# Patient Record
Sex: Male | Born: 1950 | Race: White | Hispanic: No | Marital: Married | Smoking: Never smoker
Health system: Southern US, Community
[De-identification: ages and names within clinical notes are randomized; demographics above are authoritative.]

## PROBLEM LIST (undated history)

## (undated) DIAGNOSIS — C61 Malignant neoplasm of prostate: Secondary | ICD-10-CM

## (undated) DIAGNOSIS — L409 Psoriasis, unspecified: Secondary | ICD-10-CM

## (undated) DIAGNOSIS — C4490 Unspecified malignant neoplasm of skin, unspecified: Secondary | ICD-10-CM

## (undated) DIAGNOSIS — M779 Enthesopathy, unspecified: Secondary | ICD-10-CM

## (undated) DIAGNOSIS — I4891 Unspecified atrial fibrillation: Secondary | ICD-10-CM

## (undated) DIAGNOSIS — L570 Actinic keratosis: Secondary | ICD-10-CM

## (undated) DIAGNOSIS — N400 Enlarged prostate without lower urinary tract symptoms: Secondary | ICD-10-CM

## (undated) HISTORY — DX: Benign prostatic hyperplasia without lower urinary tract symptoms: N40.0

## (undated) HISTORY — DX: Unspecified malignant neoplasm of skin, unspecified: C44.90

## (undated) HISTORY — DX: Malignant neoplasm of prostate: C61

## (undated) HISTORY — PX: OTHER SURGICAL HISTORY: SHX169

## (undated) HISTORY — DX: Unspecified atrial fibrillation: I48.91

## (undated) HISTORY — PX: COLONOSCOPY: SHX174

## (undated) HISTORY — DX: Psoriasis, unspecified: L40.9

## (undated) HISTORY — DX: Actinic keratosis: L57.0

---

## 2000-03-08 ENCOUNTER — Encounter: Payer: Self-pay | Admitting: Emergency Medicine

## 2000-03-08 ENCOUNTER — Emergency Department (HOSPITAL_COMMUNITY): Admission: EM | Admit: 2000-03-08 | Discharge: 2000-03-08 | Payer: Self-pay

## 2000-03-11 ENCOUNTER — Ambulatory Visit (HOSPITAL_COMMUNITY): Admission: RE | Admit: 2000-03-11 | Discharge: 2000-03-11 | Payer: Self-pay | Admitting: Internal Medicine

## 2000-03-11 ENCOUNTER — Encounter: Payer: Self-pay | Admitting: Internal Medicine

## 2008-07-07 ENCOUNTER — Ambulatory Visit: Payer: Self-pay | Admitting: Family Medicine

## 2008-07-07 DIAGNOSIS — I4891 Unspecified atrial fibrillation: Secondary | ICD-10-CM

## 2008-07-07 DIAGNOSIS — L408 Other psoriasis: Secondary | ICD-10-CM

## 2008-07-07 HISTORY — DX: Unspecified atrial fibrillation: I48.91

## 2008-07-14 ENCOUNTER — Telehealth: Payer: Self-pay | Admitting: *Deleted

## 2008-07-28 DIAGNOSIS — T7840XA Allergy, unspecified, initial encounter: Secondary | ICD-10-CM | POA: Insufficient documentation

## 2008-07-31 ENCOUNTER — Ambulatory Visit: Payer: Self-pay | Admitting: Family Medicine

## 2009-02-22 ENCOUNTER — Encounter: Payer: Self-pay | Admitting: Family Medicine

## 2009-03-13 ENCOUNTER — Ambulatory Visit: Payer: Self-pay | Admitting: Family Medicine

## 2009-03-13 LAB — CONVERTED CEMR LAB
Albumin: 4.1 g/dL (ref 3.5–5.2)
Alkaline Phosphatase: 46 units/L (ref 39–117)
BUN: 16 mg/dL (ref 6–23)
Basophils Absolute: 0 10*3/uL (ref 0.0–0.1)
Bilirubin Urine: NEGATIVE
Bilirubin, Direct: 0.1 mg/dL (ref 0.0–0.3)
Blood in Urine, dipstick: NEGATIVE
CO2: 29 meq/L (ref 19–32)
Calcium: 9.6 mg/dL (ref 8.4–10.5)
Cholesterol: 229 mg/dL — ABNORMAL HIGH (ref 0–200)
Creatinine, Ser: 1 mg/dL (ref 0.4–1.5)
Direct LDL: 158.4 mg/dL
Eosinophils Absolute: 0.2 10*3/uL (ref 0.0–0.7)
Glucose, Bld: 109 mg/dL — ABNORMAL HIGH (ref 70–99)
Glucose, Urine, Semiquant: NEGATIVE
HDL: 34.8 mg/dL — ABNORMAL LOW (ref >39.00)
Ketones, urine, test strip: NEGATIVE
Lymphocytes Relative: 33.8 % (ref 12.0–46.0)
MCHC: 35.3 g/dL (ref 30.0–36.0)
Neutro Abs: 3.2 10*3/uL (ref 1.4–7.7)
Neutrophils Relative %: 51.9 % (ref 43.0–77.0)
Nitrite: NEGATIVE
Platelets: 228 10*3/uL (ref 150.0–400.0)
Protein, U semiquant: NEGATIVE
RDW: 12.2 % (ref 11.5–14.6)
Specific Gravity, Urine: 1.02
Total Bilirubin: 1.1 mg/dL (ref 0.3–1.2)
Triglycerides: 195 mg/dL — ABNORMAL HIGH (ref 0.0–149.0)
Urobilinogen, UA: 0.2
WBC Urine, dipstick: NEGATIVE
pH: 7

## 2009-03-20 ENCOUNTER — Ambulatory Visit: Payer: Self-pay | Admitting: Family Medicine

## 2010-01-14 ENCOUNTER — Ambulatory Visit: Payer: Self-pay | Admitting: Family Medicine

## 2010-01-14 DIAGNOSIS — L723 Sebaceous cyst: Secondary | ICD-10-CM

## 2010-01-14 DIAGNOSIS — C4490 Unspecified malignant neoplasm of skin, unspecified: Secondary | ICD-10-CM

## 2010-01-14 HISTORY — DX: Unspecified malignant neoplasm of skin, unspecified: C44.90

## 2010-01-17 ENCOUNTER — Ambulatory Visit: Payer: Self-pay | Admitting: Family Medicine

## 2010-04-29 ENCOUNTER — Ambulatory Visit: Payer: Self-pay | Admitting: Family Medicine

## 2010-04-29 LAB — CONVERTED CEMR LAB
BUN: 17 mg/dL (ref 6–23)
Basophils Absolute: 0 10*3/uL (ref 0.0–0.1)
Bilirubin Urine: NEGATIVE
Blood in Urine, dipstick: NEGATIVE
Chloride: 104 meq/L (ref 96–112)
GFR calc non Af Amer: 82.43 mL/min (ref >60)
Glucose, Bld: 110 mg/dL — ABNORMAL HIGH (ref 70–99)
Glucose, Urine, Semiquant: NEGATIVE
HCT: 48.3 % (ref 39.0–52.0)
HDL: 41.3 mg/dL (ref >39.00)
Lymphs Abs: 2.2 10*3/uL (ref 0.7–4.0)
MCV: 92.3 fL (ref 78.0–100.0)
Monocytes Absolute: 0.8 10*3/uL (ref 0.1–1.0)
PSA: 5.09 ng/mL — ABNORMAL HIGH (ref 0.10–4.00)
Platelets: 254 10*3/uL (ref 150.0–400.0)
Potassium: 4.5 meq/L (ref 3.5–5.1)
RDW: 13.1 % (ref 11.5–14.6)
Specific Gravity, Urine: 1.015
TSH: 1.56 microintl units/mL (ref 0.35–5.50)
Total Bilirubin: 0.8 mg/dL (ref 0.3–1.2)
WBC Urine, dipstick: NEGATIVE
pH: 5

## 2010-05-06 ENCOUNTER — Encounter: Payer: Self-pay | Admitting: Family Medicine

## 2010-05-06 ENCOUNTER — Ambulatory Visit: Payer: Self-pay | Admitting: Family Medicine

## 2010-05-06 DIAGNOSIS — R351 Nocturia: Secondary | ICD-10-CM | POA: Insufficient documentation

## 2010-05-14 ENCOUNTER — Ambulatory Visit: Payer: Self-pay | Admitting: Family Medicine

## 2010-05-14 DIAGNOSIS — B079 Viral wart, unspecified: Secondary | ICD-10-CM | POA: Insufficient documentation

## 2010-05-17 ENCOUNTER — Telehealth: Payer: Self-pay | Admitting: Family Medicine

## 2010-05-17 DIAGNOSIS — L570 Actinic keratosis: Secondary | ICD-10-CM

## 2010-05-17 DIAGNOSIS — L820 Inflamed seborrheic keratosis: Secondary | ICD-10-CM

## 2010-06-18 ENCOUNTER — Other Ambulatory Visit: Payer: Self-pay | Admitting: Dermatology

## 2010-06-24 ENCOUNTER — Other Ambulatory Visit: Payer: Self-pay | Admitting: Family Medicine

## 2010-06-24 ENCOUNTER — Telehealth: Payer: Self-pay | Admitting: Family Medicine

## 2010-06-24 ENCOUNTER — Ambulatory Visit
Admission: RE | Admit: 2010-06-24 | Discharge: 2010-06-24 | Payer: Self-pay | Source: Home / Self Care | Attending: Family Medicine | Admitting: Family Medicine

## 2010-06-24 ENCOUNTER — Encounter: Payer: Self-pay | Admitting: Family Medicine

## 2010-06-24 LAB — PSA: PSA: 3.43 ng/mL (ref 0.10–4.00)

## 2010-07-02 NOTE — Assessment & Plan Note (Signed)
Summary: follow up cyst - rv   Vital Signs:  Patient profile:   60 year old male BP sitting:   120 / 84  (left arm) Cuff size:   regular  Vitals Entered By: Kern Reap CMA Duncan Dull) (January 17, 2010 8:33 AM) CC: follow-up visit   CC:  follow-up visit.  History of Present Illness: Weyman Croon is a 60 year old male, who comes in today for re-evaluation of an infected sebaceous cyst on his left scrotum, which we drained surgically.  Two days ago.  He said no complications.  No side effects from the antibiotics.  Allergies: No Known Drug Allergies  Physical Exam  General:  Well-developed,well-nourished,in no acute distress; alert,appropriate and cooperative throughout examination Genitalia:  I am unable to express any pus.  There is still some induration, but it may just be scar tissue from inflammation.   Impression & Recommendations:  Problem # 1:  SEBACEOUS CYST, INFECTED (ICD-706.2) Assessment Improved  Orders: No Charge Patient Arrived (NCPA0) (NCPA0)  Complete Medication List: 1)  Temovate 0.05 % Oint (Clobetasol propionate) .... Apply at bedtime as needed 2)  Keflex 500 Mg Caps (Cephalexin) .... 2 by mouth two times a day 3)  Vicodin Es 7.5-750 Mg Tabs (Hydrocodone-acetaminophen) .... Take 1 tablet by mouth three times a day as needed 4)  Phisohex 3 % Liqd (Hexachlorophene) .... Apply 2 x week  Patient Instructions: 1)  continue the warm soaks twice daily for one week and finish the antibiotics and return p.r.n. Prescriptions: PHISOHEX 3 % LIQD (HEXACHLOROPHENE) apply 2 x week  #1 pint x 10   Entered and Authorized by:   Roderick Pee MD   Signed by:   Roderick Pee MD on 01/17/2010   Method used:   Electronically to        [facility]  [phone]* (retail)       [address] [place], [place]  [address]       Ph: [phone] or [phone]       Fax: [phone]   RxID:   [id number]

## 2010-07-02 NOTE — Assessment & Plan Note (Signed)
Summary: CPX // RS/pt rescd//ccm   Vital Signs:  Patient profile:   60 year old male Height:      71 inches Weight:      214.5 pounds Temp:     97.9 degrees F oral Pulse rate:   76 / minute BP sitting:   122 / 80  (left arm) Cuff size:   regular  Vitals Entered By: Kathlene November LPN (May 06, 2010 11:48 AM) CC: CPE   CC:  CPE.  History of Present Illness: Manuel Neal is a 60 year old, married male, nonsmoker.........Manuel Neal retired from [employer]....... who comes in today for physical examination  He saw his been in excellent health.  He said no chronic health problems.  He does have an occasional folliculitis, for which he uses pHisoHex.  He has a spot on his right ear and one on his anterior chest wall.  They would like checked.  Also, he said, nocturia x 2 has difficulty going back to sleep would like some medicine for the BPH symptoms.  Review of systems negative except his paternal grandfather died of prostate cancer.  Tetanus posterior 2010 declines the flu shot  Current Medications (verified): 1)  Temovate 0.05 % Oint (Clobetasol Propionate) .... Apply At Bedtime As Needed 2)  Phisohex 3 % Liqd (Hexachlorophene) .... Apply 2 X Week  Allergies (verified): No Known Drug Allergies  Comments:  Nurse/Medical Assistant: The patient's medications and allergies were reviewed with the patient and were updated in the Medication and Allergy Lists. Kathlene November LPN (May 06, 2010 11:49 AM)  Past History:  Past medical, surgical, family and social histories (including risk factors) reviewed, and no changes noted (except as noted below). Past medical, surgical, family and social histories (including risk factors) reviewed for relevance to current acute and chronic problems.  Past Medical History: Reviewed history from 07/07/2008 and no changes required. psoriasis  Past Surgical History: Reviewed history from 07/07/2008 and no changes required. bone spur, right foot  Family  History: Reviewed history from 07/07/2008 and no changes required. Father: bile duct cancer Mother: healthy Siblings: none  Social History: Reviewed history from 07/07/2008 and no changes required. Occupation:tv comm. 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 No apparent alopecia or balding. Eyes:  No corneal or conjunctival inflammation noted. EOMI. Perrla. Funduscopic exam benign, without hemorrhages, exudates or papilledema. Vision grossly normal. Ears:  External ear exam shows no significant lesions or deformities.  Otoscopic examination reveals clear canals, tympanic membranes are intact bilaterally without bulging, retraction, inflammation or discharge. Hearing is grossly normal bilaterally. Nose:  External nasal examination shows no deformity or inflammation. Nasal mucosa are pink and moist without lesions or exudates. Mouth:  Oral mucosa and oropharynx without lesions or exudates.  Teeth in good repair. Neck:  No deformities, masses, or tenderness noted. Chest Wall:  No deformities, masses, tenderness or gynecomastia noted. Breasts:  No masses or gynecomastia noted Lungs:  Normal respiratory effort, chest expands symmetrically. Lungs are clear to auscultation, no crackles or wheezes. Heart:  Normal rate and regular rhythm. S1 and S2 normal without gallop, murmur, click, rub or other extra sounds. Abdomen:  Bowel sounds positive,abdomen soft and non-tender without masses, organomegaly or hernias  noted. Rectal:  No external abnormalities noted. Normal sphincter tone. No rectal masses or tenderness. Genitalia:  Testes bilaterally  descended without nodularity, tenderness or masses. No scrotal masses or lesions. No penis lesions or urethral discharge. Prostate:  Prostate gland firm and smooth, no enlargement, nodularity, tenderness, mass, asymmetry or induration. Msk:  No deformity or scoliosis noted of thoracic or lumbar spine.   Pulses:  R and L carotid,radial,femoral,dorsalis pedis and posterior tibial pulses are full and equal bilaterally Extremities:  No clubbing, cyanosis, edema, or deformity noted with normal full range of motion of all joints.   Neurologic:  No cranial nerve deficits noted. Station and gait are normal. Plantar reflexes are down-going bilaterally. DTRs are symmetrical throughout. Sensory, motor and coordinative functions appear intact. Skin:  total body skin exam normal except for a seborrheic keratosis on his right anterior upper chest wall and an irritated lesion in the pinna of his right ear Cervical Nodes:  No lymphadenopathy noted Axillary Nodes:  No palpable lymphadenopathy Inguinal Nodes:  No significant adenopathy Psych:  Cognition and judgment appear intact. Alert and cooperative with normal attention span and concentration. No apparent delusions, illusions, hallucinations   Impression & Recommendations:  Problem # 1:  HEALTH SCREENING (ICD-V70.0) Assessment Unchanged  Orders: Prescription Created Electronically [phone])  Problem # 2:  NOCTURIA (WEX-937.16) Assessment: New  Orders: Prescription Created Electronically [phone])  Complete Medication List: 1)  Temovate 0.05 % Oint (Clobetasol propionate) .... Apply at bedtime as needed 2)  Phisohex 3 % Liqd (Hexachlorophene) .... Apply 2 x week 3)  Doxazosin Mesylate 2 Mg Tabs (Doxazosin mesylate) .Manuel Neal.. 1 tab @ bedtime 4)  Doxycycline Hyclate 100 Mg Caps (Doxycycline hyclate) .... Take 1 tablet by mouth two times a day  Patient Instructions: 1)  return sometime in the next week to 10 days for a 30 minute appointment in the treatment  room to remove the two lesions we discussed. 2)  Begin doxycycline 100 mg b.i.d. x 1 month return in 6 weeks for follow-up 3)  Take an Aspirin every day. 4)  Please schedule a follow-up appointment in 1 year. Prescriptions: DOXYCYCLINE HYCLATE 100 MG CAPS (DOXYCYCLINE HYCLATE) Take 1 tablet by mouth two times a day  #60 x 1   Entered and Authorized by:   Roderick Pee MD   Signed by:   Roderick Pee MD on 05/06/2010   Method used:   Electronically to        [facility] # [phone]* (retail)       [address]       [place], [place]  [address]       Ph: [phone]       Fax: [phone]   RxID:   [id number] DOXAZOSIN MESYLATE 2 MG TABS (DOXAZOSIN MESYLATE) 1 tab @ bedtime  #100 x 3   Entered and Authorized by:   Roderick Pee MD   Signed by:   Roderick Pee MD on 05/06/2010   Method used:   Electronically to        [facility]  [phone]* (retail)       [address] [place], [place]  [address]       Ph: [phone] or [phone]       Fax: [phone]   RxID:   [id number]    Orders Added: 1)  Prescription Created Electronically [[id number]] 2)  Est. Patient 40-64 years [phone]

## 2010-07-02 NOTE — Assessment & Plan Note (Signed)
Summary: ?cyst in groin area/njr   Vital Signs:  Patient profile:   60 year old male Height:      71 inches Weight:      212 pounds BMI:     29.67 Temp:     98.1 degrees F oral BP sitting:   140 / 90  (left arm) Cuff size:   regular  Vitals Entered By: Kern Reap CMA Duncan Dull) (January 14, 2010 5:14 PM)  Procedure Note Last Tetanus: Tdap (07/07/2008)  Incision & Drainage: Date of onset: 01/11/2010 Indication: infected lesion Consent signed: yes  Procedure # 1: I & D    Size (in cm): 2.0 x 2.0    Location: scrotum    Instrument used: #15 blade w/scissors    Anesthesia: 1% lidocaine w/epinephrine  Cleaned and prepped with: alcohol and betadine Wound dressing: pressure dressing  CC: cyst on groin area   CC:  cyst on groin area.  History of Present Illness: Manuel Neal is a 60 year old, married man nonsmoker comes in today because of an infected sebaceous cyst in his left scrotum.  He said to previous sebaceous cyst drained on his back.  He noticed he had a cyst in his left scrotum and then about 3 days ago became red, hot, and swollen.  Allergies: No Known Drug Allergies   Complete Medication List: 1)  Temovate 0.05 % Oint (Clobetasol propionate) .... Apply at bedtime as needed 2)  Keflex 500 Mg Caps (Cephalexin) .... 2 by mouth two times a day 3)  Vicodin Es 7.5-750 Mg Tabs (Hydrocodone-acetaminophen) .... Take 1 tablet by mouth three times a day as needed  Other Orders: I&D Abscess, Simple / Single (10060)  Patient Instructions: 1)  warm soaks 3 times a day.  Take Keflex two tabs b.i.d. return Thursday for follow-up Prescriptions: VICODIN ES 7.5-750 MG TABS (HYDROCODONE-ACETAMINOPHEN) Take 1 tablet by mouth three times a day as needed  #10 x 0   Entered and Authorized by:   Roderick Pee MD   Signed by:   Roderick Pee MD on 01/14/2010   Method used:   Print then Give to Patient   RxID:   [id number] KEFLEX 500 MG CAPS (CEPHALEXIN) 2 by mouth two times a  day  #30 x 1   Entered and Authorized by:   Roderick Pee MD   Signed by:   Roderick Pee MD on 01/14/2010   Method used:   Electronically to        [facility] # [phone]* (retail)       [address]       [place], [place]  [address]       Ph: [phone]       Fax: [phone]   RxID:   [id number]

## 2010-07-04 NOTE — Miscellaneous (Signed)
Summary: Consent to Procedure  Consent to Procedure   Imported By: Maryln Gottron 05/17/2010 12:39:55  _____________________________________________________________________  External Attachment:    Type:   Image     Comment:   External Document

## 2010-07-04 NOTE — Progress Notes (Signed)
  Phone Note Outgoing Call   Summary of Call: I called the stan............ and to review the pathology.  The lesion that we removed from the ear may be or has some underlying cancer to it.  I recommend he call Margo Aye at [facility] dermatology for further evaluation Initial call taken by: Roderick Pee MD,  May 17, 2010 12:13 PM

## 2010-07-04 NOTE — Progress Notes (Signed)
  Phone Note Outgoing Call   Summary of Call: I called stan........... his PSA now is drop back to normal he wishes to start Cardura.  I recommend he take it at bedtime.  Follow-up in two months if the Cardura does not help.  The BPH Initial call taken by: Roderick Pee MD,  June 24, 2010 5:38 PM

## 2010-07-04 NOTE — Assessment & Plan Note (Signed)
Summary: 30 minute appt per dr todd//ccm   Procedure Note Last Tetanus: Tdap (07/07/2008)  Mole Biopsy/Removal: Indication: suspicious lesion Consent signed: yes  Procedure # 1: elliptical incision with 2 mm margin    Size (in cm): 1.0 x 1.0    Location: ear-left    Instrument used: #15 blade    Anesthesia: 1% lidocaine w/o epinephrine    Closure: cautery  Procedure # 2: elliptical incision with 2 mm margin    Size (in cm): 0.8 x 0.8    Region: anterior    Location: chest-right    Instrument used: #15 blade    Anesthesia: 1% lidocaine w/epinephrine    Closure: cautery  Cleaned and prepped with: alcohol Wound dressing: bacitracin and bandaid   History of Present Illness: stain is a 60 year old male, married, nonsmoker, who comes in today for removal of two lesions........... one on the upper pinna of his right ear.......... the second on his right anterior chest wall.......... and cryo- of 3 warts  Allergies: No Known Drug Allergies   Complete Medication List: 1)  Temovate 0.05 % Oint (Clobetasol propionate) .... Apply at bedtime as needed 2)  Phisohex 3 % Liqd (Hexachlorophene) .... Apply 2 x week 3)  Doxazosin Mesylate 2 Mg Tabs (Doxazosin mesylate) .[name].. 1 tab @ bedtime 4)  Doxycycline Hyclate 100 Mg Caps (Doxycycline hyclate) .... Take 1 tablet by mouth two times a day  Other Orders: Cryotherapy/Destruction benign or premalignant lesion (1st lesion)  (17000) Cryotherapy/Destruction benign or premalignant lesion (2nd-14th lesions) (17003) Shave Skin Lesion 0.6-1.0cm face/ears/eyelids/nose/lips/mm (11311) Shave Skin Lesion 0.6-1.0 cm/trunk/arm/leg (04540)  Patient Instructions: 1)  apply small amounts of Duofilm to the warts nightly and shave weekly.  Return p.r.n.   Orders Added: 1)  Cryotherapy/Destruction benign or premalignant lesion (1st lesion)  [17000] 2)  Cryotherapy/Destruction benign or premalignant lesion (2nd-14th lesions) [17003] 3)  Shave Skin  Lesion 0.6-1.0cm face/ears/eyelids/nose/lips/mm [11311] 4)  Shave Skin Lesion 0.6-1.0 cm/trunk/arm/leg [11301]

## 2010-11-08 ENCOUNTER — Ambulatory Visit (INDEPENDENT_AMBULATORY_CARE_PROVIDER_SITE_OTHER): Payer: Managed Care, Other (non HMO) | Admitting: Internal Medicine

## 2010-11-08 ENCOUNTER — Encounter: Payer: Self-pay | Admitting: Internal Medicine

## 2010-11-08 VITALS — BP 120/80 | Temp 99.0°F | Wt 214.0 lb

## 2010-11-08 DIAGNOSIS — J029 Acute pharyngitis, unspecified: Secondary | ICD-10-CM

## 2010-11-08 DIAGNOSIS — J069 Acute upper respiratory infection, unspecified: Secondary | ICD-10-CM

## 2010-11-08 LAB — POCT RAPID STREP A (OFFICE): Rapid Strep A Screen: NEGATIVE

## 2010-11-08 MED ORDER — AMOXICILLIN 875 MG PO TABS: 875.0000 mg | ORAL_TABLET | Freq: Two times a day (BID) | ORAL | Status: AC

## 2010-11-08 NOTE — Progress Notes (Signed)
  Subjective:    Patient ID: Manuel Neal, male    DOB: 01/09/1951, 60 y.o.   MRN: [id number]  HPI Pt presents to clinic for evaluation of ST and cough. Notes 4 day h/o sore throat with associated NP cough and LG fever. Attempted otc mucinex without improvement. No alleviating or exacerbating factors. No known sick exposures. No other complaints.  Reviewed pmh, medications and allergies.    Review of Systems  Constitutional: Positive for fever. Negative for chills.  HENT: Positive for congestion. Negative for ear discharge.   Respiratory: Positive for cough. Negative for shortness of breath and wheezing.   Skin: Negative for color change, pallor and rash.       Objective:   Physical Exam  Nursing note and vitals reviewed. Constitutional: He appears well-developed and well-nourished. No distress.  HENT:  Head: Normocephalic and atraumatic.  Right Ear: Tympanic membrane, external ear and ear canal normal.  Left Ear: Tympanic membrane, external ear and ear canal normal.  Nose: Nose normal.  Mouth/Throat: Mucous membranes are normal. Posterior oropharyngeal erythema present. No oropharyngeal exudate or posterior oropharyngeal edema.  Eyes: Conjunctivae are normal. Right eye exhibits no discharge. Left eye exhibits no discharge. No scleral icterus.  Neck: Neck supple.  Cardiovascular: Normal rate, regular rhythm and normal heart sounds.   Pulmonary/Chest: Effort normal and breath sounds normal. No respiratory distress. He has no wheezes. He has no rales.  Lymphadenopathy:    He has no cervical adenopathy.  Skin: Skin is warm and dry. No rash noted. He is not diaphoretic. No erythema.  Psychiatric: He has a normal mood and affect.          Assessment & Plan:

## 2010-11-08 NOTE — Assessment & Plan Note (Signed)
Obtained and reviewed neg rapid strep. Continue otc sx tx prn. Begin amoxicillin 875mg  bid if sx's do not improve within total of 8-10 days from onset. Followup if no improvement or worsening.

## 2011-05-29 ENCOUNTER — Other Ambulatory Visit (INDEPENDENT_AMBULATORY_CARE_PROVIDER_SITE_OTHER): Payer: Managed Care, Other (non HMO)

## 2011-05-29 DIAGNOSIS — Z0000 Encounter for general adult medical examination without abnormal findings: Secondary | ICD-10-CM

## 2011-05-29 LAB — CBC WITH DIFFERENTIAL/PLATELET
Basophils Relative: 0.5 % (ref 0.0–3.0)
Eosinophils Absolute: 0.2 10*3/uL (ref 0.0–0.7)
Eosinophils Relative: 3.3 % (ref 0.0–5.0)
Lymphocytes Relative: 33.2 % (ref 12.0–46.0)
MCHC: 33.5 g/dL (ref 30.0–36.0)
MCV: 92.8 fl (ref 78.0–100.0)
Monocytes Absolute: 0.7 10*3/uL (ref 0.1–1.0)
Neutrophils Relative %: 52.1 % (ref 43.0–77.0)
Platelets: 231 10*3/uL (ref 150.0–400.0)
RBC: 5.32 Mil/uL (ref 4.22–5.81)
WBC: 6.8 10*3/uL (ref 4.5–10.5)

## 2011-05-29 LAB — PSA: PSA: 5.45 ng/mL — ABNORMAL HIGH (ref 0.10–4.00)

## 2011-05-29 LAB — POCT URINALYSIS DIPSTICK
Blood, UA: NEGATIVE
Nitrite, UA: NEGATIVE
Protein, UA: NEGATIVE
Spec Grav, UA: 1.01
Urobilinogen, UA: 0.2
pH, UA: 7

## 2011-05-29 LAB — LIPID PANEL
Cholesterol: 236 mg/dL — ABNORMAL HIGH (ref 0–200)
Total CHOL/HDL Ratio: 6
Triglycerides: 204 mg/dL — ABNORMAL HIGH (ref 0.0–149.0)
VLDL: 40.8 mg/dL — ABNORMAL HIGH (ref 0.0–40.0)

## 2011-05-29 LAB — HEPATIC FUNCTION PANEL
Alkaline Phosphatase: 54 U/L (ref 39–117)
Bilirubin, Direct: 0.1 mg/dL (ref 0.0–0.3)
Total Bilirubin: 0.9 mg/dL (ref 0.3–1.2)
Total Protein: 7 g/dL (ref 6.0–8.3)

## 2011-05-29 LAB — BASIC METABOLIC PANEL
BUN: 18 mg/dL (ref 6–23)
Calcium: 9.7 mg/dL (ref 8.4–10.5)
Creatinine, Ser: 1 mg/dL (ref 0.4–1.5)

## 2011-06-02 ENCOUNTER — Encounter: Payer: Self-pay | Admitting: Family Medicine

## 2011-06-05 ENCOUNTER — Ambulatory Visit (INDEPENDENT_AMBULATORY_CARE_PROVIDER_SITE_OTHER): Payer: Managed Care, Other (non HMO) | Admitting: Family Medicine

## 2011-06-05 ENCOUNTER — Encounter: Payer: Self-pay | Admitting: Family Medicine

## 2011-06-05 DIAGNOSIS — L239 Allergic contact dermatitis, unspecified cause: Secondary | ICD-10-CM

## 2011-06-05 DIAGNOSIS — N4282 Prostatosis syndrome: Secondary | ICD-10-CM

## 2011-06-05 DIAGNOSIS — R000 Tachycardia, unspecified: Secondary | ICD-10-CM

## 2011-06-05 DIAGNOSIS — N4289 Other specified disorders of prostate: Secondary | ICD-10-CM

## 2011-06-05 DIAGNOSIS — L259 Unspecified contact dermatitis, unspecified cause: Secondary | ICD-10-CM

## 2011-06-05 DIAGNOSIS — R351 Nocturia: Secondary | ICD-10-CM

## 2011-06-05 MED ORDER — DOXYCYCLINE HYCLATE 100 MG PO CPEP: 100.0000 mg | ORAL_CAPSULE | Freq: Two times a day (BID) | ORAL | Status: DC

## 2011-06-05 MED ORDER — CLOBETASOL PROPIONATE 0.05 % EX OINT: TOPICAL_OINTMENT | Freq: Every day | CUTANEOUS | Status: DC

## 2011-06-05 NOTE — Patient Instructions (Signed)
Restart the doxycycline, one twice daily for one month recheck a PSA in 6 weeks.  Let's hold off on the Cardura for the BPH, until we do the cardiac evaluation.  I put in a request for you to see Dr. Edison Nasuti, one of our young, cardiologist, for cardiac eval

## 2011-06-05 NOTE — Progress Notes (Signed)
  Subjective:    Patient ID: Manuel Neal, male    DOB: [birth year], 61 y.o.   MRN: [id number]  HPI Manuel Neal is a 61 year old, married man nonsmoker former wrestler in the [employer] who comes in today for general physical examination  Please always been in excellent health.  He said no chronic health problems.  We did have him on Cardura 2 mg nightly for BPH nocturia however he quit taking it.  This past summer because it he thought it might be related to episodes of syncope.  He states over the past, summer he's had 3 episodes the first in May.  A second line a third in August of the sudden onset of rapid heart rate.  All 3 episodes occurred while he was playing golf.  He was able to finish his round, but he went home and has had an EKG and had a syncopal episode.  Then, his heart rate slowed down.  He felt fine and did not bother calling for evaluation.   Review of Systems  Constitutional: Negative.   HENT: Negative.   Eyes: Negative.   Respiratory: Negative.   Cardiovascular: Negative.   Gastrointestinal: Negative.   Genitourinary: Negative.   Musculoskeletal: Negative.   Skin: Negative.   Neurological: Negative.   Hematological: Negative.   Psychiatric/Behavioral: Negative.        Objective:   Physical Exam  Constitutional: He is oriented to person, place, and time. He appears well-developed and well-nourished.  HENT:  Head: Normocephalic and atraumatic.  Right Ear: External ear normal.  Left Ear: External ear normal.  Nose: Nose normal.  Mouth/Throat: Oropharynx is clear and moist.  Eyes: Conjunctivae and EOM are normal. Pupils are equal, round, and reactive to light.  Neck: Normal range of motion. Neck supple. No JVD present. No tracheal deviation present. No thyromegaly present.  Cardiovascular: Normal rate, regular rhythm, normal heart sounds and intact distal pulses.  Exam reveals no gallop and no friction rub.   No murmur heard. Pulmonary/Chest: Effort normal and breath sounds  normal. No stridor. No respiratory distress. He has no wheezes. He has no rales. He exhibits no tenderness.  Abdominal: Soft. Bowel sounds are normal. He exhibits no distension and no mass. There is no tenderness. There is no rebound and no guarding.  Genitourinary: Rectum normal and penis normal. Guaiac negative stool. No penile tenderness.       2+ symmetrical.  BPH  Musculoskeletal: Normal range of motion. He exhibits no edema and no tenderness.  Lymphadenopathy:    He has no cervical adenopathy.  Neurological: He is alert and oriented to person, place, and time. He has normal reflexes. No cranial nerve deficit. He exhibits normal muscle tone.  Skin: Skin is warm and dry. No rash noted. No erythema. No pallor.       Total body skin exam shows a lipoma, left upper posterior shoulder and multiple seborrheic keratosis  Psychiatric: He has a normal mood and affect. His behavior is normal. Judgment and thought content normal.          Assessment & Plan:  Healthy male.  History of episodic rapid heart rate.  Plan cardiac evaluation by Dr. Edison Nasuti.  BPH with nocturia x 3.  Restart Cardura 4 mg nightly  Elevated PSA with a history of prostatitis restart doxycycline 5 mg b.i.d. For one month then recheck PSA in 6 weeks

## 2011-06-06 ENCOUNTER — Other Ambulatory Visit: Payer: Self-pay | Admitting: *Deleted

## 2011-06-06 ENCOUNTER — Telehealth: Payer: Self-pay | Admitting: *Deleted

## 2011-06-06 DIAGNOSIS — N4282 Prostatosis syndrome: Secondary | ICD-10-CM

## 2011-06-06 MED ORDER — DOXYCYCLINE HYCLATE 100 MG PO CAPS: 100.0000 mg | ORAL_CAPSULE | Freq: Two times a day (BID) | ORAL | Status: AC

## 2011-06-06 MED ORDER — DOXYCYCLINE HYCLATE 100 MG PO TBEC: 100.0000 mg | DELAYED_RELEASE_TABLET | Freq: Two times a day (BID) | ORAL | Status: DC

## 2011-06-06 MED ORDER — CLOBETASOL PROPIONATE 0.05 % EX CREA: TOPICAL_CREAM | Freq: Two times a day (BID) | CUTANEOUS | Status: DC

## 2011-06-06 NOTE — Telephone Encounter (Signed)
The ATB will cost $800 is there anything else he can try?

## 2011-06-06 NOTE — Telephone Encounter (Signed)
Pharmacy suggests doxycycline hyclate

## 2011-06-19 ENCOUNTER — Encounter: Payer: Self-pay | Admitting: Cardiovascular Disease

## 2011-06-19 ENCOUNTER — Ambulatory Visit (INDEPENDENT_AMBULATORY_CARE_PROVIDER_SITE_OTHER): Payer: Managed Care, Other (non HMO) | Admitting: Cardiovascular Disease

## 2011-06-19 DIAGNOSIS — R0789 Other chest pain: Secondary | ICD-10-CM

## 2011-06-19 DIAGNOSIS — R55 Syncope and collapse: Secondary | ICD-10-CM

## 2011-06-19 DIAGNOSIS — R000 Tachycardia, unspecified: Secondary | ICD-10-CM

## 2011-06-19 NOTE — Assessment & Plan Note (Signed)
He reports at least one dozen episodes of syncope in the setting of arrhythmia. Monitor has been placed.  Nothing on clinical exam to suggest heart failure or previous MI. We'll wait on echocardiogram and stress test for now and proceed with cardiac monitor first.

## 2011-06-19 NOTE — Assessment & Plan Note (Signed)
We have ordered a Holter monitor that can be turned to an event monitor for 30 days if needed. Rhythms are concerning, particularly given his syncope. Of most concern would be a ventricular arrhythmia. PVC noted on EKG today though this may be an innocent finding. We have not started any medications at this time and we'll wait to confirm an arrhythmia prior to medical management.  We will see him back in close followup after his monitor.

## 2011-06-19 NOTE — Progress Notes (Signed)
   Patient ID: Manuel Neal, male    DOB: 04/22/1951, 61 y.o.   MRN: [id number]  HPI Comments: Manuel Neal is a very pleasant 61 year old gentleman with history of BPH, few cardiac risk factors with no diabetes, no smoking with no significant family history of underlying coronary artery disease who presents by referral from Dr. Tawanna Cooler for symptoms of syncope and palpitations.  He reports that over the past 8-9 months he has had numerous episodes of syncope. Typically it happens when he is stressed such as on the golf [place] where he competes. He has had episodes associated with other activities as well. He describes the symptoms as a pounding, rapid heart rate occasionally associated with dizziness and sometimes with syncope. Last episode was 4 days ago. Typically the palpitations and tachycardia last for several minutes at a time and if he relaxes they resolve on their on. He has not tried any medications in the past and has not had any workup so far. He would like to attribute the arrhythmia to previous prostate medications doxazosin he has not been on this medication for some time.  Otherwise he is healthy, active with no symptoms of chest pain or shortness of breath with exertion.  EKG shows normal sinus rhythm no significant ST-T wave changes, rare PVC   Outpatient Encounter Prescriptions as of 06/19/2011  Medication Sig Dispense Refill  . aspirin 81 MG tablet Take 81 mg by mouth daily.       . clobetasol cream (TEMOVATE) 0.05 % Apply topically 2 (two) times daily.  60 g  0     Review of Systems  Constitutional: Negative.   HENT: Negative.   Eyes: Negative.   Respiratory: Negative.   Cardiovascular: Positive for palpitations.       Tachycardia  Gastrointestinal: Negative.   Musculoskeletal: Negative.   Skin: Negative.   Neurological: Positive for dizziness and syncope.  Hematological: Negative.   Psychiatric/Behavioral: Negative.   All other systems reviewed and are negative.    BP  124/88  Pulse 84  Ht 6' (1.829 m)  Wt 224 lb (101.606 kg)  BMI 30.38 kg/m2  Physical Exam  Nursing note and vitals reviewed. Constitutional: He is oriented to person, place, and time. He appears well-developed and well-nourished.  HENT:  Head: Normocephalic.  Nose: Nose normal.  Mouth/Throat: Oropharynx is clear and moist.  Eyes: Conjunctivae are normal. Pupils are equal, round, and reactive to light.  Neck: Normal range of motion. Neck supple. No JVD present.  Cardiovascular: Normal rate, regular rhythm, S1 normal, S2 normal, normal heart sounds and intact distal pulses.  Exam reveals no gallop and no friction rub.   No murmur heard. Pulmonary/Chest: Effort normal and breath sounds normal. No respiratory distress. He has no wheezes. He has no rales. He exhibits no tenderness.  Abdominal: Soft. Bowel sounds are normal. He exhibits no distension. There is no tenderness.  Musculoskeletal: Normal range of motion. He exhibits no edema and no tenderness.  Lymphadenopathy:    He has no cervical adenopathy.  Neurological: He is alert and oriented to person, place, and time. Coordination normal.  Skin: Skin is warm and dry. No rash noted. No erythema.  Psychiatric: He has a normal mood and affect. His behavior is normal. Judgment and thought content normal.           Assessment and Plan

## 2011-06-19 NOTE — Patient Instructions (Addendum)
You are doing well. No medication changes were made.  We will place a monitor today  Please call us if you have new issues that need to be addressed before your next appt.  Your physician wants you to follow-up in: 5 weeks You will receive a reminder letter in the mail two months in advance. If you don't receive a letter, please call our office to schedule the follow-up appointment.  Try RED YEAST RICE two to four a day for cholesterol

## 2011-07-01 ENCOUNTER — Telehealth: Payer: Self-pay | Admitting: *Deleted

## 2011-07-01 ENCOUNTER — Institutional Professional Consult (permissible substitution): Payer: Managed Care, Other (non HMO) | Admitting: Cardiology

## 2011-07-01 NOTE — Telephone Encounter (Signed)
Received call from [facility] stating pt showing rhythm of a fib with RVR, HR 140s. Received reading and showed to Dr. Mariah Milling. 5 min later, another call stating he has wide complex rhythm at rate of 240. Recordings obtained, got in touch with pt and he states he is now home and he was playing golf (normally is what triggers his events), and he is asymptomatic at this time. At time of fast rate, he felt SOB, dizzy, and "pounding in chest." Pt only takes ASA 81mg  daily. Dr. Mariah Milling aware of event and has reviewed EKG which is possible A-flutter 1:1, fastest rate is at 242 bpm. Pt does state he would like to discuss options with EP, whether it be medical tx or catheter ablation. He is scheduled to see Dr. Graciela Husbands in 1 week. He knows in the meantime if sx return and he is unstable or they are sustained to call 911.

## 2011-07-01 NOTE — Telephone Encounter (Signed)
This patient seems to pop into flutter every time he plays golf and gets stressed. Due to see you in a week. thx Jorja Loa

## 2011-07-09 ENCOUNTER — Encounter: Payer: Self-pay | Admitting: Internal Medicine

## 2011-07-09 ENCOUNTER — Ambulatory Visit (INDEPENDENT_AMBULATORY_CARE_PROVIDER_SITE_OTHER): Payer: Managed Care, Other (non HMO) | Admitting: Internal Medicine

## 2011-07-09 VITALS — BP 122/81 | HR 76 | Ht 72.0 in | Wt 222.1 lb

## 2011-07-09 DIAGNOSIS — R55 Syncope and collapse: Secondary | ICD-10-CM

## 2011-07-09 DIAGNOSIS — I4891 Unspecified atrial fibrillation: Secondary | ICD-10-CM

## 2011-07-09 DIAGNOSIS — R000 Tachycardia, unspecified: Secondary | ICD-10-CM

## 2011-07-09 DIAGNOSIS — I519 Heart disease, unspecified: Secondary | ICD-10-CM

## 2011-07-09 MED ORDER — METOPROLOL SUCCINATE ER 25 MG PO TB24: 25.0000 mg | ORAL_TABLET | Freq: Every day | ORAL | Status: DC

## 2011-07-09 MED ORDER — ATENOLOL 25 MG PO TABS: 25.0000 mg | ORAL_TABLET | Freq: Every day | ORAL | Status: DC

## 2011-07-09 MED ORDER — NADOLOL 20 MG PO TABS: 20.0000 mg | ORAL_TABLET | Freq: Every day | ORAL | Status: DC

## 2011-07-09 NOTE — Assessment & Plan Note (Signed)
The patient has atrial fibrillation is quite coarse but then gives rise to a very regular tachycardia at a rate of about 250 beats per minute. I suspect that this is atrial fibrillation/flutter giving rise to one to one conduction. The beats are relatively broad but I don't suspect that this is ventricular tachycardia. We'll echocardiogram is unrevealing for cardiomyopathy. Echo to look at this.  We have discussed treatment options including AV nodal blockade, antiarrhythmic drug therapy, and catheter ablation. First the patient is quite averse to long-term medical therapy. He is willing to take short-term therapy which I think is important to prevent recurrences of this very rapid tachycardia. I've given her prescriptions for atenolol 25, metoprolol 25, and Corgard 20 to take in sequence to see if he can find one that he can tolerate while we proceed with workup.  I discussed the case with Dr. Johney Frame and were for the patient to him for consideration of primary catheter ablation for his arrhythmias given the severe nature of his associated symptoms. I will need to review with him the role of antecedent anticoagulation.

## 2011-07-09 NOTE — Patient Instructions (Addendum)
Need to try Atenolol 25 mg take one tablet daily for two weeks. Start Metoprolol succ 25 mg take one tablet daily for two weeks. Start Nadalol 20 mg take one tablet daily for two weeks.  Need to have an Echo. Need to get an appointment scheduled to see Dr. Johney Frame.

## 2011-07-09 NOTE — Assessment & Plan Note (Signed)
Syncope correlates with the rapid tachycardia which as mentioned above I presume to be one-to-one atrial flutter.

## 2011-07-09 NOTE — Progress Notes (Signed)
   History and Physical  Patient ID: Manuel Neal MRN: [id number], SOB: 1950-06-10 61 y.o. Date of Encounter: 07/09/2011, 3:17 PM  Primary Physician: Evette Georges, MD, MD Primary Cardiologist: Mariah Milling Primary Electrophysiologist:  Willaim Bane  Chief Complaint: syncope  History of Present Illness: Manuel Neal is a 61 y.o. male seen because of palpitations and syncope.  He has had a couple years history of tachycardia palpitations that have been irregular. Over the last 6 months it has more severe episodes where it there has been regularization of his palpitations and they have been associated with syncope. This primarily occurs when he is stressed, i.e. playing golf and reaching the end of the game. He's not had any these episodes of sitting down.  He has no known heart disease. There is no prior cardiac evaluation.  He does not snore.  He does not have hypertension diabetes or prior stroke.  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   Past Medical History  Diagnosis Date  . PSORIASIS 07/07/2008  . SEBACEOUS CYST, INFECTED 01/14/2010  . Nocturia 05/06/2010  . Keratosis     actinic, left ear  . Warts     multiple     Past Surgical History  Procedure Date  . Bone spur     right foot      Current Outpatient Prescriptions  Medication Sig Dispense Refill  . aspirin 81 MG tablet Take 81 mg by mouth daily.       . clobetasol cream (TEMOVATE) 0.05 % Apply topically 2 (two) times daily.  60 g  0  . doxycycline (VIBRAMYCIN) 100 MG capsule Take 100 mg by mouth 2 (two) times daily.         Allergies: No Known Allergies   History  Substance Use Topics  . Smoking status: Never Smoker   . Smokeless tobacco: Not on file  . Alcohol Use: Yes      Family History  Problem Relation Age of Onset  . Cancer Mother     bile duct      ROS:  Please see the history of present illness.    All other systems reviewed and  negative.   Vital Signs: Blood pressure 131/81, pulse 61, height 6' (1.829 m), weight 222 lb 1.9 oz (100.753 kg).  PHYSICAL EXAM: General:  Well nourished, well developed male in no acute distress HEENT: normal Lymph: no adenopathy Neck: no JVD Endocrine:  No thryomegaly Vascular: No carotid bruits; FA pulses 2+ bilaterally without bruits Cardiac:  normal S1, S2; RRR; no murmur Back: without kyphosis/scoliosis, no CVA tenderness Lungs:  clear to auscultation bilaterally, no wheezing, rhonchi or rales Abd: soft, nontender, no hepatomegaly Ext: no edema Musculoskeletal:  No deformities, BUE and BLE strength normal and equal Skin: warm and dry Neuro:  CNs 2-12 intact, no focal abnormalities noted Psych:  Normal affect   EKG:  I've misplaced his electrocardiogram demonstrated sinus rhythm at a rate of 63 don't remember being suppressed by conduction abnormalities.  Labs:   Lab Results  Component Value Date   WBC 6.8 05/29/2011   HGB 16.5 05/29/2011   HCT 49.4 05/29/2011   MCV 92.8 05/29/2011   PLT 231.0 05/29/2011   Lab Results  Component Value Date   CHOL 236* 05/29/2011   HDL 42.00 05/29/2011   TRIG 204.0* 05/29/2011     ASSESSMENT AND PLAN:    .s

## 2011-07-11 ENCOUNTER — Telehealth: Payer: Self-pay

## 2011-07-11 NOTE — Telephone Encounter (Signed)
Samples provided for pradaxa 150 mg take one tablet bid.

## 2011-07-11 NOTE — Telephone Encounter (Signed)
Gave instruction on pradaxa 150 mg bid with samples provided. The patient will call back if has any problems.

## 2011-07-11 NOTE — Telephone Encounter (Signed)
Message copied by Festus Aloe on Fri Jul 11, 2011 10:27 AM ------      Message from: Duke Salvia      Created: Thu Jul 10, 2011  5:41 PM       Jasmine December  Can  You start him on Pradaxa please so as to reduce any clots that might tend to develop in the left atrium so that they would have a chance to dissolve prior to ablation procedure      Thanks again for your help yesterday      steve      ----- Message -----         From: Gardiner Rhyme, MD         Sent: 07/09/2011  10:25 PM           To: Duke Salvia, MD            It would be OK to initiate anticoagulation.      I would prefer to use pradaxa for afib ablation.      I have not done PVI on xarelto yet, but am considering this in the near future.      For now, if you want to start anticoagulation in anticipation of ablation, I would recommend pradaxa.            Thanks.            ----- Message -----         From: Duke Salvia, MD         Sent: 07/09/2011   6:02 PM           To: Gardiner Rhyme, MD, Bishop Dublin, CMA            Should we start him on Xarelto in anticipation of your seeing him      Thanks steve

## 2011-07-11 NOTE — Telephone Encounter (Signed)
LMOM TC the office for pradaxa instruction.

## 2011-07-14 ENCOUNTER — Telehealth: Payer: Self-pay | Admitting: Internal Medicine

## 2011-07-14 NOTE — Telephone Encounter (Signed)
New Problem:     Patient called in because he was recently placed on dabigatran (PRADAXA) 150 MG CAPS by Dr. Johney Frame and when he began taking the medication his Afib and Atrial Flutter got worse and he began to have very hard constipated stool which was followed by rectal bleeding. He was wondering if these were normal side effects of taking his new medication.  Please call back.

## 2011-07-14 NOTE — Telephone Encounter (Signed)
Fu call °Pt calling back again °

## 2011-07-14 NOTE — Telephone Encounter (Signed)
SPOKE WITH PT  IS COMPLAINING OF  SMALL AMT OF RECTAL BLEEDING WITH CONSTIPATION  ALSO  LATE AFTERNOON  HEART FEELS LIKE IT IS RACING. PT WANTING TO KNOW IF NEEDS TO MOVE APPT UP WITH DR Johney Frame  PT AWARE WILL DISCUSS WITH DR Johney Frame.PER DR ALLRED   CONT PRADAXA   TRY OTC STOOL SOFTNER AND TO F/U WITH PMD  IN REGARDS TO RECTAL BLEEDING ALSO MAY INCREASE ATENOLOL TO 50 MG  FOR RACING HEART AND TO KEEP APPT AS SCHEDULED.PT AWARE./CY

## 2011-07-14 NOTE — Telephone Encounter (Signed)
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 Will forward to triage until I can get clarified with Lawanna Kobus if I am supposed to be handling Dr. Beverlee Nims calls as I am not familiar with these patient's.

## 2011-07-15 ENCOUNTER — Other Ambulatory Visit: Payer: Self-pay

## 2011-07-15 ENCOUNTER — Other Ambulatory Visit (INDEPENDENT_AMBULATORY_CARE_PROVIDER_SITE_OTHER): Payer: Managed Care, Other (non HMO) | Admitting: *Deleted

## 2011-07-15 DIAGNOSIS — I4891 Unspecified atrial fibrillation: Secondary | ICD-10-CM

## 2011-07-15 DIAGNOSIS — I519 Heart disease, unspecified: Secondary | ICD-10-CM

## 2011-07-17 ENCOUNTER — Other Ambulatory Visit (INDEPENDENT_AMBULATORY_CARE_PROVIDER_SITE_OTHER): Payer: Managed Care, Other (non HMO)

## 2011-07-17 ENCOUNTER — Other Ambulatory Visit: Payer: Managed Care, Other (non HMO)

## 2011-07-17 ENCOUNTER — Other Ambulatory Visit: Payer: Managed Care, Other (non HMO) | Admitting: *Deleted

## 2011-07-17 DIAGNOSIS — N4289 Other specified disorders of prostate: Secondary | ICD-10-CM

## 2011-07-17 DIAGNOSIS — N4282 Prostatosis syndrome: Secondary | ICD-10-CM

## 2011-07-23 ENCOUNTER — Telehealth: Payer: Self-pay | Admitting: Family Medicine

## 2011-07-23 NOTE — Telephone Encounter (Signed)
Patient called stating that he would like a call back with lab results.

## 2011-07-23 NOTE — Telephone Encounter (Signed)
Left detailed message on machine with lab results. 

## 2011-07-24 ENCOUNTER — Telehealth: Payer: Self-pay | Admitting: Internal Medicine

## 2011-07-24 NOTE — Telephone Encounter (Signed)
Attempted to call pt but i was told the call was restricted and could not go through due to [employer]

## 2011-07-24 NOTE — Telephone Encounter (Signed)
Attempted to call pt no answer. Call restricted.

## 2011-07-24 NOTE — Telephone Encounter (Signed)
Dr Klein patient 

## 2011-07-24 NOTE — Telephone Encounter (Signed)
Fu call °Patient returning your call °

## 2011-07-25 ENCOUNTER — Ambulatory Visit: Payer: Managed Care, Other (non HMO) | Admitting: Cardiovascular Disease

## 2011-07-25 ENCOUNTER — Telehealth: Payer: Self-pay | Admitting: Internal Medicine

## 2011-07-25 NOTE — Telephone Encounter (Signed)
Follow up from previous call:  Manuel Neal states someone call him this week. Returning call back Manuel Neal is aware that he has appt to see Dr. Johney Frame on 3/1.

## 2011-07-25 NOTE — Telephone Encounter (Signed)
I left a message for the patient to call his cell # as his home # is restricted by [employer].

## 2011-07-25 NOTE — Telephone Encounter (Signed)
F/U   Patient returning nurse HM call, please return  Call to patient on cell [phone]

## 2011-07-25 NOTE — Telephone Encounter (Signed)
07/25/11--called pt and gave results of ECHO--was not sure this was why Manuel Neal called pt, but could find nothing else he wasn't aware of--nt

## 2011-08-01 ENCOUNTER — Encounter: Payer: Self-pay | Admitting: Internal Medicine

## 2011-08-01 ENCOUNTER — Ambulatory Visit (INDEPENDENT_AMBULATORY_CARE_PROVIDER_SITE_OTHER): Payer: Managed Care, Other (non HMO) | Admitting: Internal Medicine

## 2011-08-01 VITALS — BP 121/82 | HR 61 | Resp 18 | Ht 72.0 in | Wt 215.8 lb

## 2011-08-01 DIAGNOSIS — I4891 Unspecified atrial fibrillation: Secondary | ICD-10-CM

## 2011-08-01 DIAGNOSIS — R55 Syncope and collapse: Secondary | ICD-10-CM

## 2011-08-01 LAB — CBC WITH DIFFERENTIAL/PLATELET
Eosinophils Relative: 3 % (ref 0–5)
HCT: 47.5 % (ref 39.0–52.0)
Hemoglobin: 16.3 g/dL (ref 13.0–17.0)
Lymphocytes Relative: 30 % (ref 12–46)
MCV: 90 fL (ref 78.0–100.0)
Monocytes Absolute: 0.7 10*3/uL (ref 0.1–1.0)
Monocytes Relative: 9 % (ref 3–12)
Neutro Abs: 4.1 10*3/uL (ref 1.7–7.7)
RDW: 12.7 % (ref 11.5–15.5)
WBC: 7.2 10*3/uL (ref 4.0–10.5)

## 2011-08-01 LAB — BASIC METABOLIC PANEL
CO2: 23 mEq/L (ref 19–32)
Calcium: 9.5 mg/dL (ref 8.4–10.5)
Glucose, Bld: 87 mg/dL (ref 70–99)
Potassium: 3.7 mEq/L (ref 3.5–5.3)
Sodium: 139 mEq/L (ref 135–145)

## 2011-08-01 MED ORDER — DABIGATRAN ETEXILATE MESYLATE 150 MG PO CAPS: 150.0000 mg | ORAL_CAPSULE | Freq: Two times a day (BID) | ORAL | Status: DC

## 2011-08-01 MED ORDER — ATENOLOL 25 MG PO TABS: 25.0000 mg | ORAL_TABLET | Freq: Every day | ORAL | Status: DC

## 2011-08-01 NOTE — Patient Instructions (Signed)
Your physician has requested that you have a TEE. During a TEE, sound waves are used to create images of your heart. It provides your doctor with information about the size and shape of your heart and how well your heart's chambers and valves are working. In this test, a transducer is attached to the end of a flexible tube that's guided down your throat and into your esophagus (the tube leading from you mouth to your stomach) to get a more detailed image of your heart. You are not awake for the procedure. Please see the instruction sheet given to you today. For further information please visit [email or web address]   Your physician has recommended that you have an ablation. Catheter ablation is a medical procedure used to treat some cardiac arrhythmias (irregular heartbeats). During catheter ablation, a long, thin, flexible tube is put into a blood vessel in your groin (upper thigh), or neck. This tube is called an ablation catheter. It is then guided to your heart through the blood vessel. Radio frequency waves destroy small areas of heart tissue where abnormal heartbeats may cause an arrhythmia to start. Please see the instruction sheet given to you today.  Manuel Neal or Manuel Neal will call you Monday with times and instructions.  [phone].

## 2011-08-02 LAB — APTT: aPTT: 36 seconds (ref 24–37)

## 2011-08-02 LAB — PROTIME-INR: INR: 0.98 (ref <1.50)

## 2011-08-03 NOTE — Assessment & Plan Note (Signed)
The patient has symptomatic atrial fibrillation and atrial flutter.  He has failed medical therapy with atenolol.  He is presently anticoagulated with pradaxa. Therapeutic strategies for afib and atrial flutter including medicine and ablation were discussed in detail with the patient today. Risk, benefits, and alternatives to EP study and radiofrequency ablation for afib were also discussed in detail today. These risks include but are not limited to stroke, bleeding, vascular damage, tamponade, perforation, damage to the esophagus, lungs, and other structures, pulmonary vein stenosis, worsening renal function, AV block requiring PPM, and death. The patient understands these risk and wishes to proceed.  We will therefore proceed with catheter ablation at the next available time.

## 2011-08-03 NOTE — Assessment & Plan Note (Signed)
Per Dr Graciela Husbands, possibly due to 1:1 atrial flutter Will plan EPS and ablation as above

## 2011-08-03 NOTE — Progress Notes (Signed)
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  He reports symptoms of tachypalpitations for several years.  Episodes have increased in frequency and duration over the past 6 months.  He finds that episodes occur frequently when playing golf or when emotionally upset.  Episodes have been associated with syncope.  He was evaluated by Dr Mariah Milling and had an event monitor placed.  This documented atrial fibrillation.  He also had a very regular tachycardia at a rate of about 250 beats per minute.  This was reviewed by Dr Graciela Husbands and felt to represent atrial flutter with 1:1 conduction rather than VT.  He has been initiated on pradaxa and atenolol  He is referred for consideration of catheter ablation for treatment of his atrial arrhythmias.  Today, he denies symptoms of chest pain, shortness of breath, lower extremity edema, snoring, or neurologic sequela. The patient is tolerating medications without difficulties and is otherwise without complaint today.   Past Medical History  Diagnosis Date  . Atrial fibrillation /Atrial flutter 07/07/2008  . Skin cancer 01/14/2010  . Nocturia 05/06/2010  . Keratosis     actinic, left ear  . Warts     multiple  . Syncope   . Psoriasis   . BPH (benign prostatic hyperplasia)    Past Surgical History  Procedure Date  . Bone spur     right foot    Current Outpatient Prescriptions  Medication Sig Dispense Refill  . atenolol (TENORMIN) 25 MG tablet Take 1 tablet (25 mg total) by mouth daily.  30 tablet  11  . dabigatran (PRADAXA) 150 MG CAPS Take 1 capsule (150 mg total) by mouth every 12 (twelve) hours.  60 capsule  11  . fish oil-omega-3 fatty acids 1000 MG capsule Take 2 g by mouth daily.      . Multiple Vitamin (MULITIVITAMIN WITH MINERALS) TABS Take 1 tablet by mouth daily.      . Red  Yeast Rice 600 MG CAPS Take by mouth.      . saw palmetto 160 MG capsule Take 160 mg by mouth 2 (two) times daily.      . clobetasol cream (TEMOVATE) 0.05 % Apply topically 2 (two) times daily.  60 g  0  . doxycycline (VIBRAMYCIN) 100 MG capsule Take 100 mg by mouth 2 (two) times daily.        No Known Allergies  History   Social History  . Marital Status: Married    Spouse Name: N/A    Number of Children: N/A  . Years of Education: N/A   Occupational History  . Not on file.   Social History Main Topics  . Smoking status: Never Smoker   . Smokeless tobacco: Not on file  . Alcohol Use: Yes     rare  . Drug Use: No  . Sexually Active: Not on file   Other Topics Concern  . 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    Family History  Problem Relation Age of Onset  . Cancer Mother     bile duct    ROS- All systems are reviewed and negative except as per the HPI above  Physical Exam: Filed Vitals:   08/01/11 1417  BP: 121/82  Pulse: 61  Resp: 18  Height: 6' (1.829 m)  Weight: 215 lb 12.8 oz (97.886 kg)    GEN- The patient is well appearing, alert and oriented x 3 today.   Head- normocephalic, atraumatic Eyes-  Sclera clear, conjunctiva pink Ears- hearing intact Oropharynx- clear Neck- supple, no JVP Lymph- no cervical lymphadenopathy Lungs- Clear to ausculation bilaterally, normal work of breathing Heart- Regular rate and rhythm, no murmurs, rubs or gallops, PMI not laterally displaced GI- soft, NT, ND, + BS Extremities- no clubbing, cyanosis, or edema MS- no significant deformity or atrophy Skin- no rash or lesion Psych- euthymic mood, full affect Neuro- strength and sensation are intact  Echo 07/15/11- EF 50-55%, mild MR, LA size 42 mm  Assessment and Plan:

## 2011-08-04 ENCOUNTER — Encounter: Payer: Self-pay | Admitting: Family Medicine

## 2011-08-04 ENCOUNTER — Encounter: Payer: Self-pay | Admitting: *Deleted

## 2011-08-04 ENCOUNTER — Encounter (HOSPITAL_COMMUNITY): Payer: Self-pay | Admitting: Pharmacy Technician

## 2011-08-04 ENCOUNTER — Ambulatory Visit (INDEPENDENT_AMBULATORY_CARE_PROVIDER_SITE_OTHER): Payer: Managed Care, Other (non HMO) | Admitting: Family Medicine

## 2011-08-04 ENCOUNTER — Other Ambulatory Visit: Payer: Self-pay | Admitting: *Deleted

## 2011-08-04 DIAGNOSIS — N400 Enlarged prostate without lower urinary tract symptoms: Secondary | ICD-10-CM | POA: Insufficient documentation

## 2011-08-04 DIAGNOSIS — I4891 Unspecified atrial fibrillation: Secondary | ICD-10-CM

## 2011-08-04 NOTE — Progress Notes (Signed)
  Patient Name: Manuel Neal Date of Birth: September 17, 1950 Age: 61 y.o. Medical Record Number: [id number] Gender: male Date of Encounter: 08/04/2011  History of Present Illness:  Manuel Neal is a 61 y.o. 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 Johney Frame tomorrow  Had some BPH, occ increased urination  No smoker AF, had RVR with pulse to 250, scheduled for ablation tomorrow  Patient Active Problem List  Diagnoses  . PSORIASIS  . NOCTURIA  . WARTS, MULTIPLE  . ACTINIC KERATOSIS, EAR, LEFT  . Syncope  . Atrial fibrillation /Atrial flutter  . BPH (benign prostatic hyperplasia)   Past Medical History  Diagnosis Date  . Atrial fibrillation /Atrial flutter 07/07/2008  . Skin cancer 01/14/2010  . Nocturia 05/06/2010  . Keratosis     actinic, left ear  . Warts     multiple  . Syncope   . Psoriasis   . BPH (benign prostatic hyperplasia)    Past Surgical History  Procedure Date  . Bone spur     right foot   History  Substance Use Topics  . Smoking status: Never Smoker   . Smokeless tobacco: Not on file  . Alcohol Use: Yes     rare   Family History  Problem Relation Age of Onset  . Cancer Mother     bile duct    Past Medical History, Surgical History, Social History, Family History, Problem List, Medications, and Allergies have been reviewed and updated if relevant.  Review of Systems:  GEN: No acute illnesses, no fevers, chills. GI: No n/v/d, eating normally Interactive and getting along well at home.  Otherwise, ROS is as per the HPI.   Physical Examination: Filed Vitals:   08/04/11 1402  BP: 114/80  Pulse: 50  Temp: 98.1 F (36.7 C)  TempSrc: Oral  Height: 6' (1.829 m)  Weight: 216 lb 12.8 oz (98.34 kg)  SpO2: 98%    Body mass index is 29.40 kg/(m^2).   GEN: WDWN, NAD, Non-toxic, A & O x 3 HEENT: Atraumatic,  Normocephalic. Neck supple. No masses, No LAD. Ears and Nose: No external deformity. CV: RRR currently PULM: CTA B, no wheezes, crackles, rhonchi. No retractions. No resp. distress. No accessory muscle use. EXTR: No c/c/e NEURO Normal gait.  PSYCH: Normally interactive. Conversant. Not depressed or anxious appearing.  Calm demeanor.   Assessment and Plan: 1. BPH (benign prostatic hyperplasia)   2. Atrial fibrillation /Atrial flutter     Doing well New to est  Ablation in am

## 2011-08-05 ENCOUNTER — Encounter (HOSPITAL_COMMUNITY)
Admission: RE | Disposition: A | Discharge status: Home / Self Care | Payer: Self-pay | Source: Ambulatory Visit | Attending: Cardiovascular Disease

## 2011-08-05 ENCOUNTER — Ambulatory Visit (HOSPITAL_COMMUNITY): Payer: Managed Care, Other (non HMO)

## 2011-08-05 ENCOUNTER — Ambulatory Visit (HOSPITAL_COMMUNITY)
Admission: RE | Admit: 2011-08-05 | Payer: Managed Care, Other (non HMO) | Source: Ambulatory Visit | Admitting: Internal Medicine

## 2011-08-05 ENCOUNTER — Encounter (HOSPITAL_COMMUNITY): Payer: Self-pay | Admitting: *Deleted

## 2011-08-05 ENCOUNTER — Ambulatory Visit (HOSPITAL_COMMUNITY)
Admission: RE | Admit: 2011-08-05 | Discharge: 2011-08-06 | Disposition: A | Discharge status: Home / Self Care | Payer: Managed Care, Other (non HMO) | Source: Ambulatory Visit | Attending: Cardiovascular Disease | Admitting: Cardiovascular Disease

## 2011-08-05 ENCOUNTER — Encounter (HOSPITAL_COMMUNITY): Payer: Self-pay

## 2011-08-05 DIAGNOSIS — I4892 Unspecified atrial flutter: Secondary | ICD-10-CM | POA: Insufficient documentation

## 2011-08-05 DIAGNOSIS — I4891 Unspecified atrial fibrillation: Secondary | ICD-10-CM

## 2011-08-05 DIAGNOSIS — N400 Enlarged prostate without lower urinary tract symptoms: Secondary | ICD-10-CM | POA: Insufficient documentation

## 2011-08-05 DIAGNOSIS — R55 Syncope and collapse: Secondary | ICD-10-CM | POA: Insufficient documentation

## 2011-08-05 HISTORY — PX: OTHER SURGICAL HISTORY: SHX169

## 2011-08-05 HISTORY — DX: Enthesopathy, unspecified: M77.9

## 2011-08-05 HISTORY — PX: TEE WITHOUT CARDIOVERSION: SHX5443

## 2011-08-05 HISTORY — PX: ATRIAL FIBRILLATION ABLATION: SHX5456

## 2011-08-05 LAB — POCT ACTIVATED CLOTTING TIME
Activated Clotting Time: 204 seconds
Activated Clotting Time: 166 seconds

## 2011-08-05 LAB — MRSA PCR SCREENING: MRSA by PCR: NEGATIVE

## 2011-08-05 SURGERY — ATRIAL FIBRILLATION ABLATION
Anesthesia: Monitor Anesthesia Care

## 2011-08-05 SURGERY — ECHOCARDIOGRAM, TRANSESOPHAGEAL
Anesthesia: Moderate Sedation

## 2011-08-05 MED ORDER — EPHEDRINE SULFATE 50 MG/ML IJ SOLN
INTRAMUSCULAR | Status: DC | PRN
  Administered 2011-08-05 (×5): 5 mg via INTRAVENOUS

## 2011-08-05 MED ORDER — PROTAMINE SULFATE 10 MG/ML IV SOLN
INTRAVENOUS | Status: DC | PRN
  Administered 2011-08-05: 10 mg via INTRAVENOUS

## 2011-08-05 MED ORDER — ONDANSETRON HCL 4 MG/2ML IJ SOLN
INTRAMUSCULAR | Status: DC | PRN
  Administered 2011-08-05: 4 mg via INTRAVENOUS

## 2011-08-05 MED ORDER — SODIUM CHLORIDE 0.9 % IJ SOLN
3.0000 mL | Freq: Two times a day (BID) | INTRAMUSCULAR | Status: DC
  Administered 2011-08-05: 3 mL via INTRAVENOUS

## 2011-08-05 MED ORDER — FENTANYL CITRATE 0.05 MG/ML IJ SOLN
INTRAMUSCULAR | Status: AC
  Filled 2011-08-05: qty 2

## 2011-08-05 MED ORDER — FENTANYL CITRATE 0.05 MG/ML IJ SOLN
INTRAMUSCULAR | Status: DC | PRN
  Administered 2011-08-05: 25 ug via INTRAVENOUS
  Administered 2011-08-05: 50 ug via INTRAVENOUS
  Administered 2011-08-05: 25 ug via INTRAVENOUS

## 2011-08-05 MED ORDER — SODIUM CHLORIDE 0.9 % IJ SOLN: 3.0000 mL | INTRAMUSCULAR | Status: DC | PRN

## 2011-08-05 MED ORDER — MIDAZOLAM HCL 10 MG/2ML IJ SOLN
INTRAMUSCULAR | Status: DC | PRN
  Administered 2011-08-05 (×3): 1 mg via INTRAVENOUS
  Administered 2011-08-05: 2 mg via INTRAVENOUS

## 2011-08-05 MED ORDER — ISOPROTERENOL HCL 0.2 MG/ML IJ SOLN
1000.0000 ug | INTRAVENOUS | Status: DC | PRN
  Administered 2011-08-05: 20 ug/kg/min via INTRAVENOUS

## 2011-08-05 MED ORDER — SODIUM CHLORIDE 0.9 % IJ SOLN: 3.0000 mL | Freq: Two times a day (BID) | INTRAMUSCULAR | Status: DC

## 2011-08-05 MED ORDER — FENTANYL CITRATE 0.05 MG/ML IJ SOLN: 250.0000 ug | Freq: Once | INTRAMUSCULAR | Status: DC

## 2011-08-05 MED ORDER — DEXTROSE 50 % IV SOLN
INTRAVENOUS | Status: AC
  Filled 2011-08-05: qty 50

## 2011-08-05 MED ORDER — MIDAZOLAM HCL 5 MG/5ML IJ SOLN
INTRAMUSCULAR | Status: DC | PRN
  Administered 2011-08-05: 2 mg via INTRAVENOUS

## 2011-08-05 MED ORDER — PROPOFOL 10 MG/ML IV EMUL
INTRAVENOUS | Status: DC | PRN
  Administered 2011-08-05: 200 mg via INTRAVENOUS

## 2011-08-05 MED ORDER — DABIGATRAN ETEXILATE MESYLATE 150 MG PO CAPS
150.0000 mg | ORAL_CAPSULE | Freq: Two times a day (BID) | ORAL | Status: DC
  Administered 2011-08-05 – 2011-08-06 (×2): 150 mg via ORAL
  Filled 2011-08-05 (×4): qty 1

## 2011-08-05 MED ORDER — ONDANSETRON HCL 4 MG/2ML IJ SOLN
4.0000 mg | Freq: Four times a day (QID) | INTRAMUSCULAR | Status: DC | PRN
  Administered 2011-08-06: 4 mg via INTRAVENOUS
  Filled 2011-08-05: qty 2

## 2011-08-05 MED ORDER — DEXTROSE 5 % IV SOLN
INTRAVENOUS | Status: AC
  Filled 2011-08-05: qty 250

## 2011-08-05 MED ORDER — HYDROXYUREA 500 MG PO CAPS
ORAL_CAPSULE | ORAL | Status: AC
  Filled 2011-08-05: qty 1

## 2011-08-05 MED ORDER — HEPARIN SODIUM (PORCINE) 1000 UNIT/ML IJ SOLN
INTRAMUSCULAR | Status: DC | PRN
  Administered 2011-08-05 (×2): 5 mL via INTRAVENOUS

## 2011-08-05 MED ORDER — LACTATED RINGERS IV SOLN
INTRAVENOUS | Status: DC | PRN
  Administered 2011-08-05 (×2): via INTRAVENOUS

## 2011-08-05 MED ORDER — BUTAMBEN-TETRACAINE-BENZOCAINE 2-2-14 % EX AERO
INHALATION_SPRAY | CUTANEOUS | Status: DC | PRN
  Administered 2011-08-05: 2 via TOPICAL

## 2011-08-05 MED ORDER — HYDROCODONE-ACETAMINOPHEN 5-325 MG PO TABS
1.0000 | ORAL_TABLET | ORAL | Status: DC | PRN
  Administered 2011-08-05: 2 via ORAL
  Filled 2011-08-05: qty 2

## 2011-08-05 MED ORDER — HEPARIN SODIUM (PORCINE) 1000 UNIT/ML IJ SOLN
INTRAMUSCULAR | Status: AC
  Filled 2011-08-05: qty 1

## 2011-08-05 MED ORDER — LIDOCAINE HCL (CARDIAC) 20 MG/ML IV SOLN
INTRAVENOUS | Status: DC | PRN
  Administered 2011-08-05: 80 mg via INTRAVENOUS

## 2011-08-05 MED ORDER — ACETAMINOPHEN 325 MG PO TABS: 650.0000 mg | ORAL_TABLET | ORAL | Status: DC | PRN

## 2011-08-05 MED ORDER — SODIUM CHLORIDE 0.45 % IV SOLN
INTRAVENOUS | Status: DC
  Administered 2011-08-05: 09:00:00 via INTRAVENOUS

## 2011-08-05 MED ORDER — BENZOCAINE 20 % MT SOLN: 1.0000 "application " | OROMUCOSAL | Status: DC | PRN

## 2011-08-05 MED ORDER — MIDAZOLAM HCL 10 MG/2ML IJ SOLN: 10.0000 mg | Freq: Once | INTRAMUSCULAR | Status: DC

## 2011-08-05 MED ORDER — MIDAZOLAM HCL 10 MG/2ML IJ SOLN
INTRAMUSCULAR | Status: AC
  Filled 2011-08-05: qty 2

## 2011-08-05 MED ORDER — FENTANYL CITRATE 0.05 MG/ML IJ SOLN
INTRAMUSCULAR | Status: DC | PRN
  Administered 2011-08-05 (×5): 25 ug via INTRAVENOUS
  Administered 2011-08-05: 50 ug via INTRAVENOUS
  Administered 2011-08-05: 25 ug via INTRAVENOUS

## 2011-08-05 MED ORDER — SODIUM CHLORIDE 0.9 % IV SOLN: 250.0000 mL | INTRAVENOUS | Status: DC | PRN

## 2011-08-05 NOTE — H&P (View-Only) (Signed)
Primary Care Physician: Spencer Copland, MD, MD Referring Physician:  Dr Klein   Manuel Neal is a 60 y.o. male with a h/o paroxysmal atrial fibrillation and atrial flutter who presents today for further EP evaluation.  He reports symptoms of tachypalpitations for several years.  Episodes have increased in frequency and duration over the past 6 months.  He finds that episodes occur frequently when playing golf or when emotionally upset.  Episodes have been associated with syncope.  He was evaluated by Dr Gollan and had an event monitor placed.  This documented atrial fibrillation.  He also had a very regular tachycardia at a rate of about 250 beats per minute.  This was reviewed by Dr Klein and felt to represent atrial flutter with 1:1 conduction rather than VT.  He has been initiated on pradaxa and atenolol  He is referred for consideration of catheter ablation for treatment of his atrial arrhythmias.  Today, he denies symptoms of chest pain, shortness of breath, lower extremity edema, snoring, or neurologic sequela. The patient is tolerating medications without difficulties and is otherwise without complaint today.   Past Medical History  Diagnosis Date  . Atrial fibrillation /Atrial flutter 07/07/2008  . Skin cancer 01/14/2010  . Nocturia 05/06/2010  . Keratosis     actinic, left ear  . Warts     multiple  . Syncope   . Psoriasis   . BPH (benign prostatic hyperplasia)    Past Surgical History  Procedure Date  . Bone spur     right foot    Current Outpatient Prescriptions  Medication Sig Dispense Refill  . atenolol (TENORMIN) 25 MG tablet Take 1 tablet (25 mg total) by mouth daily.  30 tablet  11  . dabigatran (PRADAXA) 150 MG CAPS Take 1 capsule (150 mg total) by mouth every 12 (twelve) hours.  60 capsule  11  . fish oil-omega-3 fatty acids 1000 MG capsule Take 2 g by mouth daily.      . Multiple Vitamin (MULITIVITAMIN WITH MINERALS) TABS Take 1 tablet by mouth daily.      . Red  Yeast Rice 600 MG CAPS Take by mouth.      . saw palmetto 160 MG capsule Take 160 mg by mouth 2 (two) times daily.      . clobetasol cream (TEMOVATE) 0.05 % Apply topically 2 (two) times daily.  60 g  0  . doxycycline (VIBRAMYCIN) 100 MG capsule Take 100 mg by mouth 2 (two) times daily.        No Known Allergies  History   Social History  . Marital Status: Married    Spouse Name: N/A    Number of Children: N/A  . Years of Education: N/A   Occupational History  . Not on file.   Social History Main Topics  . Smoking status: Never Smoker   . Smokeless tobacco: Not on file  . Alcohol Use: Yes     rare  . Drug Use: No  . Sexually Active: Not on file   Other Topics Concern  . 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    Family History  Problem Relation Age of Onset  . Cancer Mother     bile duct    ROS- All systems are reviewed and negative except as per the HPI above  Physical Exam: Filed Vitals:   08/01/11 1417  BP: 121/82    Pulse: 61  Resp: 18  Height: 6' (1.829 m)  Weight: 215 lb 12.8 oz (97.886 kg)    GEN- The patient is well appearing, alert and oriented x 3 today.   Head- normocephalic, atraumatic Eyes-  Sclera clear, conjunctiva pink Ears- hearing intact Oropharynx- clear Neck- supple, no JVP Lymph- no cervical lymphadenopathy Lungs- Clear to ausculation bilaterally, normal work of breathing Heart- Regular rate and rhythm, no murmurs, rubs or gallops, PMI not laterally displaced GI- soft, NT, ND, + BS Extremities- no clubbing, cyanosis, or edema MS- no significant deformity or atrophy Skin- no rash or lesion Psych- euthymic mood, full affect Neuro- strength and sensation are intact  Echo 07/15/11- EF 50-55%, mild MR, LA size 42 mm  Assessment and Plan:  

## 2011-08-05 NOTE — Progress Notes (Signed)
  Echocardiogram Echocardiogram Transesophageal has been performed.  Dorena Cookey 08/05/2011, 11:01 AM

## 2011-08-05 NOTE — Transfer of Care (Signed)
Immediate Anesthesia Transfer of Care Note  Patient: Manuel Neal  Procedure(s) Performed: Procedure(s) (LRB): ATRIAL FIBRILLATION ABLATION (N/A)  Patient Location: PACU  Anesthesia Type: General  Level of Consciousness: awake and patient cooperative  Airway & Oxygen Therapy: Patient Spontanous Breathing and Patient connected to face mask oxygen  Post-op Assessment: Report given to PACU RN, Post -op Vital signs reviewed and stable, Patient moving all extremities and Patient moving all extremities X 4  Post vital signs: Reviewed and stable  Complications: No apparent anesthesia complications

## 2011-08-05 NOTE — Op Note (Signed)
    Transesophageal Echocardiogram Note  KHALEB BROZ [id number] 1950-09-28  Procedure: Transesophageal Echocardiogram Indications: pre-op atrial fib ablation.  Procedure Details Consent: Obtained Time Out: Verified patient identification, verified procedure, site/side was marked, verified correct patient position, special equipment/implants available, Radiology Safety Procedures followed,  medications/allergies/relevent history reviewed, required imaging and test results available.  Performed  Medications: Fentanyl: 100 mcg IV Versed: 5 mg mg IV  Left Ventrical:  Normal LV function  Mitral Valve: normal MR. Trivial MR  Aortic Valve: normal 3 leaflet aortic valve. No AS or AI  Tricuspid Valve: normal . Trivial TR  Pulmonic Valve: Trace PI  Left Atrium/ Left atrial appendage: no thrombi., no smoke  Atrial septum: intact. No PFO or ASD  Aorta: normal.   Complications: No apparent complications Patient did tolerate procedure well.  OK for A-Fib ablation.   Vesta Mixer, Montez Hageman., MD, [facility] 08/05/2011, 10:40 AM

## 2011-08-05 NOTE — Progress Notes (Signed)
Doing well s/p afib ablation. No concerns Hemodynamics are stable Heart rate is increased s/p autonomic modulation (RF at junction of SVC and RA). On exam, groins/ neuro are stable  Limited echo tonight by me reveals no effusion.   Will plan to DC to home in am Resume home pradaxa 150mg  BID tonight and continue uninterrupted x 3 months Will need to add Protonix 40mg  daily at discharge for 6 weeks for esophageal protection post ablation.  Follow-up with me in 12 weeks.

## 2011-08-05 NOTE — Anesthesia Preprocedure Evaluation (Addendum)
Anesthesia Evaluation  Patient identified by MRN, date of birth, ID band Patient awake    Reviewed: Allergy & Precautions, H&P , NPO status , Patient's Chart, lab work & pertinent test results  Airway Mallampati: II TM Distance: >3 FB Neck ROM: Full    Dental  (+) Dental Advisory Given   Pulmonary          Cardiovascular + dysrhythmias     Neuro/Psych    GI/Hepatic   Endo/Other    Renal/GU      Musculoskeletal   Abdominal   Peds  Hematology   Anesthesia Other Findings   Reproductive/Obstetrics                         Anesthesia Physical Anesthesia Plan  ASA: II  Anesthesia Plan: MAC   Post-op Pain Management:    Induction: Intravenous  Airway Management Planned: Simple Face Mask  Additional Equipment:   Intra-op Plan:   Post-operative Plan:   Informed Consent: I have reviewed the patients History and Physical, chart, labs and discussed the procedure including the risks, benefits and alternatives for the proposed anesthesia with the patient or authorized representative who has indicated his/her understanding and acceptance.     Plan Discussed with: CRNA and Surgeon  Anesthesia Plan Comments:         Anesthesia Quick Evaluation

## 2011-08-05 NOTE — Anesthesia Procedure Notes (Signed)
Procedure Name: LMA Insertion Date/Time: 08/05/2011 12:27 PM Performed by: Malachi Pro Pre-anesthesia Checklist: Patient identified, Emergency Drugs available, Suction available, Timeout performed and Patient being monitored Oxygen Delivery Method: Circle system utilized Preoxygenation: Pre-oxygenation with 100% oxygen Intubation Type: Combination inhalational/ intravenous induction Ventilation: Mask ventilation without difficulty LMA: LMA inserted and LMA with gastric port inserted LMA Size: 5.0 Number of attempts: 1 Placement Confirmation: CO2 detector and breath sounds checked- equal and bilateral Tube secured with: Tape

## 2011-08-05 NOTE — Interval H&P Note (Signed)
History and Physical Interval Note:  08/05/2011 11:46 AM  Manuel Neal  has presented today for surgery, with the diagnosis of afib, atrial flutter, and SVT.  The various methods of treatment have been discussed with the patient and family. After consideration of risks, benefits and other options for treatment, the patient has consented to  Procedure(s) (LRB): EP study and radiofrequency ablation as a surgical intervention .  The patients' history has been reviewed, patient examined, no change in status, stable for surgery.  I have reviewed the patients' chart and labs.  Questions were answered to the patient's satisfaction.     Hillis Range

## 2011-08-05 NOTE — Anesthesia Postprocedure Evaluation (Signed)
  Anesthesia Post-op Note  Patient: Manuel Neal  Procedure(s) Performed: Procedure(s) (LRB): ATRIAL FIBRILLATION ABLATION (N/A)  Patient Location: PACU  Anesthesia Type: General  Level of Consciousness: awake  Airway and Oxygen Therapy: Patient Spontanous Breathing  Post-op Pain: mild  Post-op Assessment: Post-op Vital signs reviewed  Post-op Vital Signs: stable  Complications: No apparent anesthesia complications

## 2011-08-05 NOTE — Interval H&P Note (Signed)
History and Physical Interval Note:  08/05/2011 10:17 AM  Manuel Neal  has presented today for surgery, with the diagnosis of A-Fib  The various methods of treatment have been discussed with the patient and family. After consideration of risks, benefits and other options for treatment, the patient has consented to  Procedure(s) (LRB): TRANSESOPHAGEAL ECHOCARDIOGRAM (TEE) (N/A) as a surgical intervention .  The patients' history has been reviewed, patient examined, no change in status, stable for surgery.  I have reviewed the patients' chart and labs.  Questions were answered to the patient's satisfaction.     Elyn Aquas.

## 2011-08-05 NOTE — Op Note (Signed)
SURGEON:  Hillis Range, MD  PREPROCEDURE DIAGNOSES: 1. Paroxysmal atrial fibrillation. 2. Atrial flutter  POSTPROCEDURE DIAGNOSES: 1. Paroxysmal atrial fibrillation. 2. Atrial flutter  PROCEDURES: 1. Comprehensive electrophysiologic study. 2. Coronary sinus pacing and recording. 3. Three-dimensional mapping of supraventricular tachycardia (afib with right atrial mapping of atrial flutter isthmus) 4. Ablation of supraventricular tachycardia (afib with right atrial ablation of atrial flutter isthmus). 5. Arterial blood pressure monitoring. 6. Intracardiac echocardiography. 7. Transseptal puncture of an intact septum. 8.  Rotational Angiography with processing at an independent workstation 9. Arrhythmia induction with pacing with isuprel infusion  INTRODUCTION:  Manuel Neal is a 61 y.o. male with a history of paroxysmal atrial fibrillation and atrial flutter who now presents for EP study and radiofrequency ablation.  The patient has been documented to have episodes of atrial fibrillation.  Recent event monitor reviewed by Dr Graciela Husbands is also felt to represent atrial flutter. The patient reports increasing frequency and duration of atrial arrhythmias since that time. He has had syncope with his afib. The patient presents today for catheter ablation of atrial fibrillation and atrial flutter.  DESCRIPTION OF PROCEDURE:  Informed written consent was obtained, and the patient was brought to the electrophysiology lab in a fasting state.  The patient was adequately sedated with intravenous medications as outlined in the anesthesia report.  The patient's left and right groins were prepped and draped in the usual sterile fashion by the EP lab staff.  Using a percutaneous Seldinger technique, two 7-French and one 8-French hemostasis sheaths were placed into the right common femoral vein.  A 4- [place] hemostasis sheath was placed in the right common femoral artery for blood pressure monitoring.  An  11-French hemostasis sheath was placed into the left common femoral vein.   3 Dimensional Rotational Angiography: A 5 french pigtail catheter was introduced through the right common femoral vein and advanced into the inferior venocava.  3 demential rotational angiography was then performed by power injection of 100cc of nonionic contrast.  Reprocessing at an independent work station was then performed.   This demonstrated a moderate sized left atrium with 4 separate pulmonary veins which were also moderate in size.  There were no anomalous veins or significant abnormalities.  A 3 dimensional rendering of the left atrium was then merged using [employer] onto the [employer] system and registered with intracardiac echo (see below).  The pigtail catheter was then removed.  Catheter Placement:  A 7-French Biosense Webster Decapolar coronary sinus catheter was introduced through the right common femoral vein and advanced into the coronary sinus for recording and pacing from this location.  A 6-French quadripolar Josephson catheter was introduced through the right common femoral vein and advanced into the right ventricle for recording and pacing.  This catheter was then pulled back to the His bundle location.    Initial Measurements: The patient presented to the electrophysiology lab in sinus rhythm.  His PR interval measured 213 with a QRS duringation of 93 and a QT interval of 433 msec.  The AH interval measured 89 and the HV interval measured 50 msec.   Ventricular pacing reveals VA dissociation at baseline.  Rapid atrial pacing revealed an AV wenkebach cycle length of 630 msec with nonstustained atrial fibrillation when pacing down to a CL of 300 msec.    Intracardiac Echocardiography: A 10-French Biosense Webster AcuNav intracardiac echocardiography catheter was introduced through the left common femoral vein and advanced into the right atrium. Intracardiac echocardiography was performed of  the left atrium, and a three-dimensional anatomical rendering of the left atrium was performed using CARTO sound technology.  The patient was noted to have a moderate sized left atrium.  The interatrial septum was prominent but not aneurysmal. All 4 pulmonary veins were visualized and noted to have separate ostia.  The pulmonary veins were moderate in size.  The left atrial appendage was visualized and did not reveal thrombus.   There was no evidence of pulmonary vein stenosis.   Transseptal Puncture: The middle right common femoral vein sheath was exchanged for an 8.5 [place] SL2 transseptal sheath and transseptal access was achieved in a standard fashion using a Brockenbrough needle under biplane fluoroscopy with intracardiac echocardiography confirmation of the transseptal puncture.  Once transseptal access had been achieved, heparin was administered intravenously and intra- arterially in order to maintain an ACT of greater than 350 seconds throughout the procedure.   3D Mapping and Ablation: The His bundle catheter was removed and in its place a 3.5 mm Biosense Webster EZ [employer] ablation catheter was advanced into the right atrium.  The transseptal sheath was pulled back into the IVC over a guidewire.  The ablation catheter was advanced across the transseptal hole using the wire as a guide.  The transseptal sheath was then re-advanced over the guidewire into the left atrium.  A duodecapolar Biosense Webster circular mapping catheter was introduced through the transseptal sheath and positioned over the mouth of all 4 pulmonary veins.  Three-dimensional electroanatomical mapping was performed using CARTO technology.  This demonstrated electrical activity within all four pulmonary veins at baseline. The patient underwent successful sequential electrical isolation and anatomical encircling of all four pulmonary veins using radiofrequency current with a circular mapping catheter as a guide.  The  ablation catheter was then pulled back into the right atrial and positioned along the cavo-tricuspid isthmus.  Mapping along the right atrial side of the isthmus was performed.  This demonstrated a standard isthmus.  A series of radiofrequency applications were then delivered along the isthmus.  Complete bidirectional cavotricuspid isthmus block was achieved as confirmed by differential atrial pacing from the low lateral right atrium.  A stimulus to earliest atrial activation across the isthmus measured 170 msec bi-directionally.  The patient was observe without return of conduction through the isthmus.  3D mapping was performed at the junction of the superior vena cava and right atrium.  Electrical activity was observed within the SVC.  I therefore elected to perform right atrial ablation in this area.  A series of radiofrequency applications were delivered in a circular fashion around the ostium of the SVC.  Prior to each ablation lesion, pacing was performed from the distal ablation electrode to insure that diaphragmatic stimulation was not observed to avoid phrenic nerve injury.  Diaphragmatic excursion was also observed during ablation.    Measurements Following Ablation: Following ablation, Isuprel was infused up to 20 mcg/min with no inducible atrial fibrillation, atrial tachycardia, atrial flutter, or sustained PACs. In sinus rhythm with RR interval was 665, with PR , QRS 76 msec, and Qtc 401 msec.  Following ablation the AH interval measured with an HV interval of 45 msec. Ventricular pacing was performed, which again revealed VA dissociation even with isuprel infusion.  During isuprel washout rapid atrial pacing was performed, which revealed an AV Wenckebach cycle length of 420 msec.  Electroisolation was then again confirmed in all four pulmonary veins.  Intracardiac echocardiography was again performed, which revealed no pericardial effusion.  The procedure  was therefore  considered completed.  All catheters were removed, and the sheaths were aspirated and flushed.  The patient was transferred to the recovery area for sheath removal per protocol.  A limited bedside transthoracic echocardiogram revealed no pericardial effusion.  There were no early apparent complications.  CONCLUSIONS: 1. Sinus rhythm upon presentation.   2. Rotational Angiography reveals a moderate sized left atrium with four separate pulmonary veins without evidence of pulmonary vein stenosis. 3. Successful electrical isolation and anatomical encircling of all four pulmonary veins with radiofrequency current. 4. Cavo-tricuspid isthmus ablation was performed with complete bidirectional isthmus block achieved.     Additional right atrial mapping and ablation was performed at the junction of the right atrium and superior vena cava.  5. No inducible arrhythmias following ablation both on and off of Isuprel 6. No early apparent complications.   Hiram Mciver,MD 3:36 PM 08/05/2011

## 2011-08-06 ENCOUNTER — Encounter (HOSPITAL_COMMUNITY): Payer: Self-pay | Admitting: Cardiovascular Disease

## 2011-08-06 DIAGNOSIS — I4891 Unspecified atrial fibrillation: Secondary | ICD-10-CM

## 2011-08-06 LAB — BASIC METABOLIC PANEL
CO2: 29 mEq/L (ref 19–32)
Calcium: 8.7 mg/dL (ref 8.4–10.5)
GFR calc Af Amer: >90 mL/min (ref >90)
GFR calc non Af Amer: 89 mL/min — ABNORMAL LOW (ref >90)
Sodium: 140 mEq/L (ref 135–145)

## 2011-08-06 MED ORDER — PANTOPRAZOLE SODIUM 40 MG PO TBEC: 40.0000 mg | DELAYED_RELEASE_TABLET | Freq: Every day | ORAL | Status: DC

## 2011-08-06 NOTE — Progress Notes (Signed)
  Patient Name: Manuel Neal      SUBJECTIVE:s/p RFCA of Pulmonary veins and flutter isthmus Some nausea this am improved post zofran  Past Medical History  Diagnosis Date  . Atrial fibrillation /Atrial flutter 07/07/2008  . Skin cancer 01/14/2010  . Nocturia 05/06/2010  . Keratosis     actinic, left ear  . Warts     multiple  . Syncope   . Psoriasis   . BPH (benign prostatic hyperplasia)   . Bone spur     RT great toe    PHYSICAL EXAM Filed Vitals:   08/06/11 0000 08/06/11 0340 08/06/11 0400 08/06/11 0759  BP:  97/60  107/65  Pulse:  72  66  Temp: 98.5 F (36.9 C)  98.4 F (36.9 C) 98.5 F (36.9 C)  TempSrc: Oral  Oral Oral  Resp:  12  15  Height:      Weight:      SpO2:  96%  98%    Well developed and nourished in no acute distress HENT normal Neck supple with JVP-flat Clear Regular rate and rhythm, no murmurs or gallops Abd-soft with active BS Groins ok No Clubbing cyanosis edema Skin-warm and dry A & Oriented  Grossly normal sensory and motor function   TELEMETRY: Reviewed telemetry pt in NSR:    Intake/Output Summary (Last 24 hours) at 08/06/11 1023 Last data filed at 08/05/11 2300  Gross per 24 hour  Intake   1740 ml  Output    550 ml  Net   1190 ml    LABS: Basic Metabolic Panel:  Lab 08/06/11 4098 08/01/11 1532  NA 140 139  K 3.4* 3.7  CL 105 103  CO2 29 23  GLUCOSE 104* 87  BUN 11 22  CREATININE 0.97 1.14  CALCIUM 8.7 9.5  MG -- --  PHOS -- --   Cardiac Enzymes: No results found for this basename: CKTOTAL:3,CKMB:3,CKMBINDEX:3,TROPONINI:3 in the last 72 hours CBC:  Lab 08/01/11 1532  WBC 7.2  NEUTROABS 4.1  HGB 16.3  HCT 47.5  MCV 90.0  PLT 239     ASSESSMENT AND PLAN:  Patient Active Hospital Problem List: Syncope (06/19/2011)  Atrial fibrillation /Atrial flutter (07/07/2008) S/p RFCA  Will plan discharge today on Pradaxa 150 bid x 36m Stop atenolol 2/2 relatively low BP--limited echo normal last  pm Nausea-discharge if able to tolerate lunch PPI x 6 weeks _Protoxnix  F/u JA 2 months LHC SK 4 m LHB    Signed, Sherryl Manges MD  08/06/2011

## 2011-08-06 NOTE — Plan of Care (Signed)
Problem: Consults Goal: EP Study Patient Education (See Patient Education module for education specifics.) Outcome: Completed/Met Date Met:  08/06/11 Done on  3/5  Problem: Phase I Progression Outcomes Goal: Pain controlled with appropriate interventions Outcome: Not Applicable Date Met:  08/06/11 No c/o pain Goal: Initial discharge plan identified Outcome: Completed/Met Date Met:  08/06/11 Return home

## 2011-08-06 NOTE — Discharge Summary (Signed)
Patient ID: Manuel Neal,  MRN: [id number], DOB/AGE: [birth year] 60 y.o.  Admit date: 08/05/2011 Discharge date: 08/06/2011  Primary Care Provider: Hannah Beat, MD Primary Cardiologist: Odessa Fleming, MD/J. Allred, MD  Discharge Diagnoses Principal Problem:  *Atrial fibrillation /Atrial flutter Active Problems:  Syncope  BPH (benign prostatic hyperplasia)  Allergies No Known Allergies  Procedures  08/05/2011 TEE  Left Ventrical: Normal LV function  Mitral Valve: normal MR. Trivial MR  Aortic Valve: normal 3 leaflet aortic valve. No AS or AI  Tricuspid Valve: normal . Trivial TR  Pulmonic Valve: Trace PI  Left Atrium/ Left atrial appendage: no thrombi., no smoke  Atrial septum: intact. No PFO or ASD  Aorta: normal.  08/05/2011 EP study with RFCA of afib/flutter CONCLUSIONS:  1. Sinus rhythm upon presentation.  2. Rotational Angiography reveals a moderate sized left atrium with four separate pulmonary veins without evidence of pulmonary vein stenosis.  3. Successful electrical isolation and anatomical encircling of all four pulmonary veins with radiofrequency current.  4. Cavo-tricuspid isthmus ablation was performed with complete bidirectional isthmus block achieved.  Additional right atrial mapping and ablation was performed at the junction of the right atrium and superior vena cava.  5. No inducible arrhythmias following ablation both on and off of Isuprel  6. No early apparent complications.   History of Present Illness  61 y/o male with the above problem list.  He recently saw Dr. Johney Frame in clinic secondary to a history of tachypalpitations found to be atrial fibrillation by event monitoring.  He also was noted to have a 1;1 atrial flutter.  He was placed on pradaxa and BB therapy and referred for catheter ablation.  Hospital Course  Pt presented to [facility] on 3/5.  He underwent a TEE, which showed no evidence of LA/LAA thrombus.  He then was taken to the EP lab where he  underwent EP study and successful afib/flutter RFCA.  Post procedure limited echo showed no evidence of pericardial effusion.  He has had no atrial arrhythmias post procedure and will be discharged home today in good condition.  Discharge Vitals Blood pressure 107/65, pulse 66, temperature 98.5 F (36.9 C), temperature source Oral, resp. rate 15, height 6' (1.829 m), weight 213 lb 13.5 oz (97 kg), SpO2 98.00%.  Filed Weights   08/05/11 1615  Weight: 213 lb 13.5 oz (97 kg)   Labs  CBC  Basic Metabolic Panel  Basename 08/06/11 0535  NA 140  K 3.4*  CL 105  CO2 29  GLUCOSE 104*  BUN 11  CREATININE 0.97  CALCIUM 8.7  MG --  PHOS --   Disposition  Pt is being discharged home today in good condition.  Follow-up Plans & Appointments  Follow-up Information    Follow up with Hillis Range, MD on 10/15/2011. (10:00 AM)    Contact information:   [address], [address] [place] [address] [phone]       Follow up with Hannah Beat, MD. (As scheduled)    Contact information:   [address] [place] [address] [place] [address] [phone]       Follow up with Sherryl Manges, MD in 4 months. (we'll arrange)    Contact information:   [phone]         Discharge Medications  Medication List  As of 08/06/2011 11:55 AM   STOP taking these medications         atenolol 25 MG tablet  TAKE these medications         dabigatran 150 MG Caps   Commonly known as: PRADAXA   Take 150 mg by mouth every 12 (twelve) hours.      pantoprazole 40 MG tablet   Commonly known as: PROTONIX   Take 1 tablet (40 mg total) by mouth daily.      Red Yeast Rice 600 MG Caps   Take by mouth every morning.      saw palmetto 500 MG capsule   Take 150 mg by mouth 2 (two) times daily.           Outstanding Labs/Studies  None  Duration of Discharge Encounter   Greater than 30 minutes including physician  time.  Signed, Nicolasa Ducking NP 08/06/2011, 11:55 AM

## 2011-08-06 NOTE — Discharge Instructions (Signed)
NO HEAVY LIFTING OR SEXUAL ACTIVITY X 7 DAYS. NO DRIVING X 2-3 DAYS. NO SOAKING BATHS, HOT TUBS, POOLS, ETC., X 7 DAYS.  ***PLEASE REMEMBER TO BRING ALL OF YOUR MEDICATIONS TO EACH OF YOUR FOLLOW-UP OFFICE VISITS.

## 2011-08-25 ENCOUNTER — Other Ambulatory Visit: Payer: Self-pay | Admitting: Internal Medicine

## 2011-08-25 MED ORDER — DABIGATRAN ETEXILATE MESYLATE 150 MG PO CAPS: 150.0000 mg | ORAL_CAPSULE | Freq: Two times a day (BID) | ORAL | Status: DC

## 2011-08-25 NOTE — Telephone Encounter (Signed)
Spoke with patient and let him know he will need to be on this for 3 months s/p ablation.  I have called the [facility] office and asked them to give him a discount Rx card and some samples  Patient is aware that they samples and card for him in the [place] ofice

## 2011-08-25 NOTE — Telephone Encounter (Signed)
Refill- dabigatran (PRADAXA) 150 MG CAPS  Patient can be reached at [phone] for additional information, verified pharmacy as [facility] [address]    Patient also requests a return call from nurse to discuss medications, wants to know how long he will have to remain on Pradaxa.

## 2011-09-17 ENCOUNTER — Telehealth: Payer: Self-pay | Admitting: Internal Medicine

## 2011-09-17 NOTE — Telephone Encounter (Signed)
Spoke with patient and let him know this med is not covered under his drug plan.  We will provide him with samples for the next 2 months

## 2011-09-17 NOTE — Telephone Encounter (Signed)
Patient returning  Nurse KL call regarding Pradaxa samples.  Patient has been having issues with the prescription and was told he cold pick up some samples from [facility].   Please return call to patient at [phone] regarding a pick up time.

## 2011-10-15 ENCOUNTER — Encounter: Payer: Self-pay | Admitting: Internal Medicine

## 2011-10-15 ENCOUNTER — Ambulatory Visit (INDEPENDENT_AMBULATORY_CARE_PROVIDER_SITE_OTHER): Payer: Managed Care, Other (non HMO) | Admitting: Internal Medicine

## 2011-10-15 VITALS — BP 110/72 | HR 62 | Ht 72.0 in | Wt 202.0 lb

## 2011-10-15 DIAGNOSIS — I4891 Unspecified atrial fibrillation: Secondary | ICD-10-CM

## 2011-10-15 NOTE — Patient Instructions (Signed)
Your physician recommends that you schedule a follow-up appointment in: 4 months with Dr Johney Frame   Your physician has recommended you make the following change in your medication:  1) Stop Pradaxa after finishing what you have

## 2011-10-15 NOTE — Progress Notes (Signed)
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 male who presents today for routine electrophysiology followup.  Since his afib ablation, the patient reports doing very well.  He denies procedure related problems.  He has had brief ERAF early post ablation, but this has recently improved.  Today, he denies symptoms of palpitations, chest pain, shortness of breath,  lower extremity edema, dizziness, presyncope, or syncope.  The patient is otherwise without complaint today.   Past Medical History  Diagnosis Date  . Atrial fibrillation /Atrial flutter 07/07/2008  . Skin cancer 01/14/2010  . Nocturia 05/06/2010  . Keratosis     actinic, left ear  . Warts     multiple  . Syncope   . Psoriasis   . BPH (benign prostatic hyperplasia)   . Bone spur     RT great toe   Past Surgical History  Procedure Date  . Bone spur     right foot  . Tee without cardioversion 08/05/2011    Procedure: TRANSESOPHAGEAL ECHOCARDIOGRAM (TEE);  Surgeon: Elyn Aquas., MD;  Location: [facility] ENDOSCOPY;  Service: Cardiovascular;  Laterality: N/A;  . Atrial fibrillation and atrial flutter ablation 08/05/11    PVI and CTI ablation by Dr Johney Frame    Current Outpatient Prescriptions  Medication Sig Dispense Refill  . dabigatran (PRADAXA) 150 MG CAPS Take 1 capsule (150 mg total) by mouth every 12 (twelve) hours.  60 capsule  3  . Multiple Vitamin (MULTIVITAMIN) tablet Take 1 tablet by mouth daily.      . Red Yeast Rice 600 MG CAPS Take by mouth every morning.      . saw palmetto 500 MG capsule Take 150 mg by mouth 2 (two) times daily.        Physical Exam: Filed Vitals:   10/15/11 1006  BP: 110/72  Pulse: 62  Height: 6' (1.829 m)  Weight: 202 lb (91.627 kg)    GEN- The patient is well appearing, alert and oriented x 3 today.   Head- normocephalic, atraumatic Eyes-  Sclera clear, conjunctiva pink Ears- hearing intact Oropharynx- clear Lungs- Clear to ausculation  bilaterally, normal work of breathing Heart- Regular rate and rhythm, no murmurs, rubs or gallops, PMI not laterally displaced GI- soft, NT, ND, + BS Extremities- no clubbing, cyanosis, or edema  ekg today reveals sinus rhythm 62 bpm, otherwise normal ekg  Assessment and Plan:

## 2011-10-15 NOTE — Assessment & Plan Note (Signed)
Doing well s/p ablation off of AAD His CHADS2VASC score is 0.  I will therefore stop pradaxa at this time. Recent guidelines would suggest that he does not require aspirin.  He will return in 4 months.

## 2012-01-29 ENCOUNTER — Encounter: Payer: Self-pay | Admitting: *Deleted

## 2012-01-30 ENCOUNTER — Encounter: Payer: Self-pay | Admitting: Internal Medicine

## 2012-01-30 ENCOUNTER — Ambulatory Visit (INDEPENDENT_AMBULATORY_CARE_PROVIDER_SITE_OTHER): Payer: Managed Care, Other (non HMO) | Admitting: Internal Medicine

## 2012-01-30 VITALS — BP 120/70 | HR 73 | Resp 18 | Ht 72.0 in | Wt 201.2 lb

## 2012-01-30 DIAGNOSIS — I4891 Unspecified atrial fibrillation: Secondary | ICD-10-CM

## 2012-01-30 NOTE — Assessment & Plan Note (Signed)
No recurrence s/p ablation off of AAD No changes today  Return in 6 months

## 2012-01-30 NOTE — Progress Notes (Signed)
PCP: Hannah Beat, MD  Manuel Neal is a 61 y.o. male who presents today for routine electrophysiology followup.  He has had no further afib since his ablation.  He is active and pleased with the results of his procedure.  Today, he denies symptoms of chest pain, shortness of breath,  lower extremity edema, dizziness, presyncope, or syncope.  He has rare palpitations typically lasting only several seconds.  The patient is otherwise without complaint today.   Past Medical History  Diagnosis Date  . Atrial fibrillation /Atrial flutter 07/07/2008  . Skin cancer 01/14/2010  . Nocturia 05/06/2010  . Keratosis     actinic, left ear  . Warts     multiple  . Syncope   . Psoriasis   . BPH (benign prostatic hyperplasia)   . Bone spur     RT great toe   Past Surgical History  Procedure Date  . Bone spur     right foot  . Tee without cardioversion 08/05/2011    Procedure: TRANSESOPHAGEAL ECHOCARDIOGRAM (TEE);  Surgeon: Elyn Aquas., MD;  Location: [facility] ENDOSCOPY;  Service: Cardiovascular;  Laterality: N/A;  . Atrial fibrillation and atrial flutter ablation 08/05/11    PVI and CTI ablation by Dr Johney Frame    Current Outpatient Prescriptions  Medication Sig Dispense Refill  . Multiple Vitamin (MULTIVITAMIN) tablet Take 1 tablet by mouth daily.      . Red Yeast Rice 600 MG CAPS Take by mouth every morning.        Physical Exam: Filed Vitals:   01/30/12 1106  BP: 120/70  Pulse: 73  Resp: 18  Height: 6' (1.829 m)  Weight: 201 lb 3.2 oz (91.264 kg)  SpO2: 97%    GEN- The patient is well appearing, alert and oriented x 3 today.   Head- normocephalic, atraumatic Eyes-  Sclera clear, conjunctiva pink Ears- hearing intact Oropharynx- clear Lungs- Clear to ausculation bilaterally, normal work of breathing Heart- Regular rate and rhythm, no murmurs, rubs or gallops, PMI not laterally displaced GI- soft, NT, ND, + BS Extremities- no clubbing, cyanosis, or edema  ekg today reveals sinus  rhythm 73 bpm, otherwise normal ekg  Assessment and Plan:

## 2012-01-30 NOTE — Patient Instructions (Signed)
Your physician wants you to follow-up in: 6 months with Dr. Allred. You will receive a reminder letter in the mail two months in advance. If you don't receive a letter, please call our office to schedule the follow-up appointment.  

## 2012-06-29 ENCOUNTER — Other Ambulatory Visit: Payer: Self-pay | Admitting: Family Medicine

## 2012-06-29 DIAGNOSIS — E785 Hyperlipidemia, unspecified: Secondary | ICD-10-CM

## 2012-06-29 DIAGNOSIS — Z79899 Other long term (current) drug therapy: Secondary | ICD-10-CM

## 2012-06-29 DIAGNOSIS — Z125 Encounter for screening for malignant neoplasm of prostate: Secondary | ICD-10-CM

## 2012-06-30 ENCOUNTER — Other Ambulatory Visit: Payer: Managed Care, Other (non HMO)

## 2012-07-01 ENCOUNTER — Other Ambulatory Visit (INDEPENDENT_AMBULATORY_CARE_PROVIDER_SITE_OTHER): Payer: Managed Care, Other (non HMO)

## 2012-07-01 DIAGNOSIS — E785 Hyperlipidemia, unspecified: Secondary | ICD-10-CM

## 2012-07-01 DIAGNOSIS — Z125 Encounter for screening for malignant neoplasm of prostate: Secondary | ICD-10-CM

## 2012-07-01 DIAGNOSIS — Z79899 Other long term (current) drug therapy: Secondary | ICD-10-CM

## 2012-07-01 LAB — BASIC METABOLIC PANEL
CO2: 28 mEq/L (ref 19–32)
GFR: 85.81 mL/min (ref >60.00)
Glucose, Bld: 89 mg/dL (ref 70–99)
Potassium: 4.7 mEq/L (ref 3.5–5.1)
Sodium: 140 mEq/L (ref 135–145)

## 2012-07-01 LAB — HEPATIC FUNCTION PANEL
ALT: 29 U/L (ref 0–53)
Albumin: 4.3 g/dL (ref 3.5–5.2)
Total Bilirubin: 0.9 mg/dL (ref 0.3–1.2)

## 2012-07-01 LAB — CBC WITH DIFFERENTIAL/PLATELET
Basophils Absolute: 0 10*3/uL (ref 0.0–0.1)
HCT: 49.8 % (ref 39.0–52.0)
Lymphs Abs: 2.2 10*3/uL (ref 0.7–4.0)
MCHC: 33.5 g/dL (ref 30.0–36.0)
MCV: 90.9 fl (ref 78.0–100.0)
Monocytes Absolute: 0.7 10*3/uL (ref 0.1–1.0)
Platelets: 238 10*3/uL (ref 150.0–400.0)
RDW: 13.4 % (ref 11.5–14.6)

## 2012-07-01 LAB — LDL CHOLESTEROL, DIRECT: Direct LDL: 160.4 mg/dL

## 2012-07-01 LAB — LIPID PANEL
Cholesterol: 240 mg/dL — ABNORMAL HIGH (ref 0–200)
Triglycerides: 136 mg/dL (ref 0.0–149.0)

## 2012-07-07 ENCOUNTER — Ambulatory Visit (INDEPENDENT_AMBULATORY_CARE_PROVIDER_SITE_OTHER): Payer: Managed Care, Other (non HMO) | Admitting: Family Medicine

## 2012-07-07 ENCOUNTER — Encounter: Payer: Self-pay | Admitting: Internal Medicine

## 2012-07-07 ENCOUNTER — Encounter: Payer: Self-pay | Admitting: Family Medicine

## 2012-07-07 VITALS — BP 120/72 | HR 75 | Temp 97.8°F | Ht 72.0 in | Wt 204.0 lb

## 2012-07-07 DIAGNOSIS — Z1211 Encounter for screening for malignant neoplasm of colon: Secondary | ICD-10-CM

## 2012-07-07 DIAGNOSIS — Z0000 Encounter for general adult medical examination without abnormal findings: Secondary | ICD-10-CM

## 2012-07-07 DIAGNOSIS — R972 Elevated prostate specific antigen [PSA]: Secondary | ICD-10-CM

## 2012-07-07 MED ORDER — CLOBETASOL PROPIONATE 0.05 % EX CREA: TOPICAL_CREAM | Freq: Two times a day (BID) | CUTANEOUS | Status: AC

## 2012-07-07 NOTE — Patient Instructions (Addendum)
REFERRAL: GO THE THE FRONT ROOM AT THE ENTRANCE OF OUR CLINIC, NEAR CHECK IN. ASK FOR MARION. SHE WILL HELP YOU SET UP YOUR REFERRAL. DATE: TIME:  

## 2012-07-07 NOTE — Progress Notes (Signed)
Nature conservation officer at [facility] [address] [place] [address] Phone: [phone] Fax: [phone]  Date:  07/07/2012   Name:  Manuel Neal   DOB:  1951/03/08   MRN:  [id number] Gender: male Age: 62 y.o.  Primary Physician:  Hannah Beat, MD  Evaluating MD: Hannah Beat, MD   Chief Complaint: Annual Exam   History of Present Illness:  Manuel Neal is a 62 y.o. pleasant patient who presents with the following:  CPX:  Zostavax:  Colon - had one at 51. Had it done across from [facility]. [facility].  Preventative Health Maintenance Visit:  Health Maintenance Summary Reviewed and updated, unless pt declines services.  Tobacco History Reviewed. Alcohol: No concerns, no excessive use Exercise Habits: regularly exercises STD concerns: no risk or activity to increase risk Drug Use: None Encouraged self-testicular check  Health Maintenance  Topic Date Due  . Colonoscopy  01/04/2002  . Zostavax  01/05/2012  . Influenza Vaccine  02/01/2012  . Tetanus/tdap  07/07/2018    Labs reviewed with the patient.  Results for orders placed in visit on 07/01/12  LIPID PANEL      Component Value Range   Cholesterol 240 (*) 0 - 200 mg/dL   Triglycerides 366.4  0.0 - 149.0 mg/dL   HDL 40.34  >74.25 mg/dL   VLDL 95.6  0.0 - 38.7 mg/dL   Total CHOL/HDL Ratio 6    CBC WITH DIFFERENTIAL      Component Value Range   WBC 6.4  4.5 - 10.5 K/uL   RBC 5.48  4.22 - 5.81 Mil/uL   Hemoglobin 16.7  13.0 - 17.0 g/dL   HCT 56.4  33.2 - 95.1 %   MCV 90.9  78.0 - 100.0 fl   MCHC 33.5  30.0 - 36.0 g/dL   RDW 88.4  16.6 - 06.3 %   Platelets 238.0  150.0 - 400.0 K/uL   Neutrophils Relative 51.0  43.0 - 77.0 %   Lymphocytes Relative 34.2  12.0 - 46.0 %   Monocytes Relative 11.0  3.0 - 12.0 %   Eosinophils Relative 3.2  0.0 - 5.0 %   Basophils Relative 0.6  0.0 - 3.0 %   Neutro Abs 3.3  1.4 - 7.7 K/uL   Lymphs Abs 2.2  0.7 - 4.0 K/uL   Monocytes Absolute 0.7  0.1 - 1.0 K/uL   Eosinophils Absolute 0.2  0.0 - 0.7 K/uL   Basophils Absolute 0.0  0.0 - 0.1 K/uL  HEPATIC FUNCTION PANEL      Component Value Range   Total Bilirubin 0.9  0.3 - 1.2 mg/dL   Bilirubin, Direct 0.1  0.0 - 0.3 mg/dL   Alkaline Phosphatase 51  39 - 117 U/L   AST 23  0 - 37 U/L   ALT 29  0 - 53 U/L   Total Protein 7.0  6.0 - 8.3 g/dL   Albumin 4.3  3.5 - 5.2 g/dL  BASIC METABOLIC PANEL      Component Value Range   Sodium 140  135 - 145 mEq/L   Potassium 4.7  3.5 - 5.1 mEq/L   Chloride 106  96 - 112 mEq/L   CO2 28  19 - 32 mEq/L   Glucose, Bld 89  70 - 99 mg/dL   BUN 24 (*) 6 - 23 mg/dL   Creatinine, Ser 1.0  0.4 - 1.5 mg/dL   Calcium 9.6  8.4 - 01.6 mg/dL   GFR 01.09  >  60.00 mL/min  PSA      Component Value Range   PSA 4.22 (*) 0.10 - 4.00 ng/mL  LDL CHOLESTEROL, DIRECT      Component Value Range   Direct LDL 160.4       Patient Active Problem List  Diagnosis  . PSORIASIS  . WARTS, MULTIPLE  . ACTINIC KERATOSIS, EAR, LEFT  . Syncope  . Atrial fibrillation /Atrial flutter  . BPH (benign prostatic hyperplasia)    Past Medical History  Diagnosis Date  . Atrial fibrillation /Atrial flutter 07/07/2008  . Skin cancer 01/14/2010  . Nocturia 05/06/2010  . Keratosis     actinic, left ear  . Warts     multiple  . Syncope   . Psoriasis   . BPH (benign prostatic hyperplasia)   . Bone spur     RT great toe    Past Surgical History  Procedure Date  . Bone spur     right foot  . Tee without cardioversion 08/05/2011    Procedure: TRANSESOPHAGEAL ECHOCARDIOGRAM (TEE);  Surgeon: Elyn Aquas., MD;  Location: [facility] ENDOSCOPY;  Service: Cardiovascular;  Laterality: N/A;  . Atrial fibrillation and atrial flutter ablation 08/05/11    PVI and CTI ablation by Dr Johney Frame    History  Substance Use Topics  . Smoking status: Never Smoker   . Smokeless tobacco: Never Used  . Alcohol Use: Yes     Comment: rare    Family History  Problem Relation Age of Onset  . Cancer Mother      bile duct    No Known Allergies  Medication list has been reviewed and updated.  Outpatient Prescriptions Prior to Visit  Medication Sig Dispense Refill  . Multiple Vitamin (MULTIVITAMIN) tablet Take 1 tablet by mouth daily.      . Red Yeast Rice 600 MG CAPS Take by mouth every morning.       Last reviewed on 07/07/2012  8:40 AM by Consuello Masse, CMA  Review of Systems:   General: Denies fever, chills, sweats. No significant weight loss. Eyes: Denies blurring,significant itching ENT: Denies earache, sore throat, and hoarseness. Cardiovascular: Denies chest pains, palpitations, dyspnea on exertion Respiratory: Denies cough, dyspnea at rest,wheeezing Breast: no concerns about lumps GI: Denies nausea, vomiting, diarrhea, constipation, change in bowel habits, abdominal pain, melena, hematochezia GU: Denies penile discharge, ED, urinary flow / outflow problems. No STD concerns. Musculoskeletal: Denies back pain, joint pain Derm: occ psoriasis, lipoma on back, seb k's Neuro: Denies  paresthesias, frequent falls, frequent headaches Psych: Denies depression, anxiety Endocrine: Denies cold intolerance, heat intolerance, polydipsia Heme: Denies enlarged lymph nodes Allergy: No hayfever   Physical Examination: BP 120/72  Pulse 75  Temp 97.8 F (36.6 C) (Oral)  Ht 6' (1.829 m)  Wt 204 lb (92.534 kg)  BMI 27.67 kg/m2  SpO2 96%  Ideal Body Weight: Weight in (lb) to have BMI = 25: 183.9    Wt Readings from Last 3 Encounters:  07/07/12 204 lb (92.534 kg)  01/30/12 201 lb 3.2 oz (91.264 kg)  10/15/11 202 lb (91.627 kg)    GEN: well developed, well nourished, no acute distress Eyes: conjunctiva and lids normal, PERRLA, EOMI ENT: TM clear, nares clear, oral exam WNL Neck: supple, no lymphadenopathy, no thyromegaly, no JVD Pulm: clear to auscultation and percussion, respiratory effort normal CV: regular rate and rhythm, S1-S2, no murmur, rub or gallop, no bruits, peripheral  pulses normal and symmetric, no cyanosis, clubbing, edema or varicosities Chest: no scars,  masses GI: soft, non-tender; no hepatosplenomegaly, masses; active bowel sounds all quadrants GU: no hernia, testicular mass, penile discharge. Moderately enlarged prostate enlargement without nodules Lymph: no cervical, axillary or inguinal adenopathy MSK: gait normal, muscle tone and strength WNL, no joint swelling, effusions, discoloration, crepitus  SKIN: clear, good turgor, color WNL, no rashes, lesions, or ulcerations Neuro: normal mental status, normal strength, sensation, and motion Psych: alert; oriented to person, place and time, normally interactive and not anxious or depressed in appearance.   Assessment and Plan:  1. Routine general medical examination at a health care facility    2. Elevated PSA  Ambulatory referral to Urology  3. Colon cancer screening  Ambulatory referral to Gastroenterology   The patient's preventative maintenance and recommended screening tests for an annual wellness exam were reviewed in full today. Brought up to date unless services declined.  Counselled on the importance of diet, exercise, and its role in overall health and mortality. The patient's FH and SH was reviewed, including their home life, tobacco status, and drug and alcohol status.   Zostavax script given Elevated PSA at 4.2 on [facility], former pt of Dr. Tawanna Cooler, has trended upwards on PSA, no prior urology consult, no previous biopsy, which at this point is very appropriate to get urological consultation.  Colon referral  Orders Today:  Orders Placed This Encounter  Procedures  . Ambulatory referral to Urology    Referral Priority:  Routine    Referral Type:  Consultation    Referral Reason:  Specialty Services Required    Requested Specialty:  Urology    Number of Visits Requested:  1  . Ambulatory referral to Gastroenterology    Referral Priority:  Routine    Referral Type:   Consultation    Referral Reason:  Specialty Services Required    Requested Specialty:  Gastroenterology    Number of Visits Requested:  1    Updated Medication List: (Includes new medications, updates to list, dose adjustments) Meds ordered this encounter  Medications  . aspirin 81 MG tablet    Sig: Take 81 mg by mouth daily.  . saw palmetto 160 MG capsule    Sig: Take 160 mg by mouth 2 (two) times daily.  . clobetasol cream (TEMOVATE) 0.05 %    Sig: Apply topically 2 (two) times daily.    Dispense:  60 g    Refill:  2    Medications Discontinued: There are no discontinued medications.   Signed, Elpidio Galea. Pam Vanalstine, MD 07/07/2012 8:45 AM

## 2012-07-13 ENCOUNTER — Ambulatory Visit (INDEPENDENT_AMBULATORY_CARE_PROVIDER_SITE_OTHER): Payer: Managed Care, Other (non HMO) | Admitting: *Deleted

## 2012-07-13 DIAGNOSIS — Z2911 Encounter for prophylactic immunotherapy for respiratory syncytial virus (RSV): Secondary | ICD-10-CM

## 2012-07-13 DIAGNOSIS — Z23 Encounter for immunization: Secondary | ICD-10-CM

## 2012-07-19 ENCOUNTER — Telehealth: Payer: Self-pay | Admitting: *Deleted

## 2012-07-19 NOTE — Telephone Encounter (Signed)
Pt stated a new medication and has been having some increased afib over the last week and wants to know if the medication could be causing this. Rapaflo 8 mg, pt started on 2/10 pt taking for [facility].

## 2012-07-19 NOTE — Telephone Encounter (Signed)
Discussed with Dr Johney Frame, it could be related to the new medication and all he has going on with his prostrate.  He is going to stop medication for a few days and see if this helps.  He also has d/c caffeine as he was drinking a lot of caffenated  Coffee.  He will call me back if this does not seem to help.  His HR's have been low 70-75

## 2012-08-09 ENCOUNTER — Ambulatory Visit (INDEPENDENT_AMBULATORY_CARE_PROVIDER_SITE_OTHER): Payer: Managed Care, Other (non HMO) | Admitting: Internal Medicine

## 2012-08-09 ENCOUNTER — Encounter: Payer: Self-pay | Admitting: Internal Medicine

## 2012-08-09 VITALS — BP 127/84 | HR 98 | Ht 72.0 in | Wt 208.4 lb

## 2012-08-09 DIAGNOSIS — I4891 Unspecified atrial fibrillation: Secondary | ICD-10-CM

## 2012-08-09 NOTE — Assessment & Plan Note (Signed)
Well controlled s/p ablation off of AAD CHADS2VASC score is 0. No changes at this time

## 2012-08-09 NOTE — Progress Notes (Signed)
PCP: Hannah Beat, MD  Manuel Neal is a 62 y.o. male who presents today for routine electrophysiology followup. He has done well for a year off of AAD s/p afib ablation.  He reports that he recently took Rapaflow for his prostatic enlargement.  He developed a significant increase in palpitations on this medicine.  He stopped the medication and his palpitations have resolved. Today, he denies symptoms of chest pain, shortness of breath,  lower extremity edema, dizziness, presyncope, or syncope.   The patient is otherwise without complaint today.   Past Medical History  Diagnosis Date  . Atrial fibrillation /Atrial flutter 07/07/2008  . Skin cancer 01/14/2010  . Nocturia 05/06/2010  . Keratosis     actinic, left ear  . Warts     multiple  . Prostate cancer   . Psoriasis   . BPH (benign prostatic hyperplasia)   . Bone spur     RT great toe   Past Surgical History  Procedure Laterality Date  . Bone spur      right foot  . Tee without cardioversion  08/05/2011    Procedure: TRANSESOPHAGEAL ECHOCARDIOGRAM (TEE);  Surgeon: Elyn Aquas., MD;  Location: [facility] ENDOSCOPY;  Service: Cardiovascular;  Laterality: N/A;  . Atrial fibrillation and atrial flutter ablation  08/05/11    PVI and CTI ablation by Dr Johney Frame    Current Outpatient Prescriptions  Medication Sig Dispense Refill  . aspirin 81 MG tablet Take 81 mg by mouth daily.      . clobetasol cream (TEMOVATE) 0.05 % Apply topically 2 (two) times daily.  60 g  2  . Dutasteride (AVODART PO) Take by mouth once.      . Multiple Vitamin (MULTIVITAMIN) tablet Take 1 tablet by mouth daily.      . Red Yeast Rice 600 MG CAPS Take by mouth every morning.      . saw palmetto 160 MG capsule Take 160 mg by mouth 2 (two) times daily.       No current facility-administered medications for this visit.    Physical Exam: Filed Vitals:   08/09/12 1529  BP: 127/84  Pulse: 98  Height: 6' (1.829 m)  Weight: 208 lb 6.4 oz (94.53 kg)    GEN- The  patient is well appearing, alert and oriented x 3 today.   Head- normocephalic, atraumatic Eyes-  Sclera clear, conjunctiva pink Ears- hearing intact Oropharynx- clear Lungs- Clear to ausculation bilaterally, normal work of breathing Heart- Regular rate and rhythm, no murmurs, rubs or gallops, PMI not laterally displaced GI- soft, NT, ND, + BS Extremities- no clubbing, cyanosis, or edema  ekg today reveals sinus rhythm 99 bpm, inferior infarction, present on prior ekgs otherwise normal ekg  Assessment and Plan:

## 2012-08-09 NOTE — Patient Instructions (Addendum)
Your physician wants you to follow-up in: 6 months with Dr. Allred. You will receive a reminder letter in the mail two months in advance. If you don't receive a letter, please call our office to schedule the follow-up appointment.  

## 2012-08-18 ENCOUNTER — Ambulatory Visit (AMBULATORY_SURGERY_CENTER): Payer: Managed Care, Other (non HMO) | Admitting: *Deleted

## 2012-08-18 VITALS — Ht 72.0 in | Wt 208.8 lb

## 2012-08-18 DIAGNOSIS — Z1211 Encounter for screening for malignant neoplasm of colon: Secondary | ICD-10-CM

## 2012-08-18 MED ORDER — NA SULFATE-K SULFATE-MG SULF 17.5-3.13-1.6 GM/177ML PO SOLN: ORAL | Status: DC

## 2012-08-19 ENCOUNTER — Encounter: Payer: Self-pay | Admitting: Internal Medicine

## 2012-08-26 ENCOUNTER — Ambulatory Visit (INDEPENDENT_AMBULATORY_CARE_PROVIDER_SITE_OTHER): Payer: Managed Care, Other (non HMO) | Admitting: Family Medicine

## 2012-08-26 ENCOUNTER — Encounter: Payer: Self-pay | Admitting: Family Medicine

## 2012-08-26 VITALS — BP 130/70 | HR 68 | Temp 98.0°F | Ht 72.0 in | Wt 207.5 lb

## 2012-08-26 DIAGNOSIS — N498 Inflammatory disorders of other specified male genital organs: Secondary | ICD-10-CM

## 2012-08-26 DIAGNOSIS — N492 Inflammatory disorders of scrotum: Secondary | ICD-10-CM | POA: Insufficient documentation

## 2012-08-26 DIAGNOSIS — C61 Malignant neoplasm of prostate: Secondary | ICD-10-CM | POA: Insufficient documentation

## 2012-08-26 MED ORDER — DOXYCYCLINE HYCLATE 100 MG PO TABS: 100.0000 mg | ORAL_TABLET | Freq: Two times a day (BID) | ORAL | Status: DC

## 2012-08-26 MED ORDER — CEFTRIAXONE SODIUM 1 G IJ SOLR
1.0000 g | Freq: Once | INTRAMUSCULAR | Status: AC
  Administered 2012-08-26: 1 g via INTRAMUSCULAR

## 2012-08-26 NOTE — Addendum Note (Signed)
Addended by: Consuello Masse on: 08/26/2012 11:57 AM   Modules accepted: Orders

## 2012-08-26 NOTE — Patient Instructions (Addendum)
Start antibiotic today twice daily.  Apply warm compresses 3-4 times a day for 15 minutes at a time. Please make an appt with me tomorrow morning (work him in please).

## 2012-08-26 NOTE — Assessment & Plan Note (Signed)
Area has drained, no clear area of fluctuance. Will treat with warm compresses and treat with antibiotics for ceelulitis surrounding. If not significantly better in 24 hours will refer to surgeon given area of lesion for exploration of wound.

## 2012-08-26 NOTE — Progress Notes (Signed)
  Subjective:    Patient ID: Manuel Neal, male    DOB: Mar 18, 1951, 62 y.o.   MRN: [id number]  HPI  62 year old male presents with redness and pain  X 4 days in area where cyst has been in past on left scrotum.  Rendess in size was half diameter of golf ball. Underware rubs there. Last night  he lesion ruptured, and drained. Feels much better No fever. Lymph nodes B in groin are sore and swollen.  Had history of cyst in same area  Few years ago with Dr. Tawanna Cooler. It was lanced at that time.  Review of Systems  Constitutional: Negative for fever and fatigue.  HENT: Negative for congestion.   Eyes: Negative for pain.  Respiratory: Negative for shortness of breath.   Cardiovascular: Negative for chest pain, palpitations and leg swelling.  Gastrointestinal: Negative for nausea, vomiting and abdominal pain.       Objective:   Physical Exam  Constitutional: He appears well-developed and well-nourished.  Eyes: Conjunctivae are normal. Pupils are equal, round, and reactive to light.  Neck: Normal range of motion. Neck supple.  Cardiovascular: Normal rate and regular rhythm.   Pulmonary/Chest: Effort normal and breath sounds normal.  Abdominal: Soft. Bowel sounds are normal.  Genitourinary: Penis normal. No penile tenderness.  Left scrotal sac with area of induration exquisitely tender 1 inch diameter. No fluctuant area  Erythema surrounding in 2 inch diameter area  Lymphadenopathy:       Left: Inguinal adenopathy present.          Assessment & Plan:

## 2012-08-27 ENCOUNTER — Encounter: Payer: Self-pay | Admitting: Family Medicine

## 2012-08-27 ENCOUNTER — Telehealth: Payer: Self-pay | Admitting: Internal Medicine

## 2012-08-27 ENCOUNTER — Ambulatory Visit (INDEPENDENT_AMBULATORY_CARE_PROVIDER_SITE_OTHER): Payer: Managed Care, Other (non HMO) | Admitting: Family Medicine

## 2012-08-27 VITALS — Temp 97.5°F | Ht 72.0 in | Wt 207.0 lb

## 2012-08-27 DIAGNOSIS — N492 Inflammatory disorders of scrotum: Secondary | ICD-10-CM

## 2012-08-27 DIAGNOSIS — N498 Inflammatory disorders of other specified male genital organs: Secondary | ICD-10-CM

## 2012-08-27 NOTE — Telephone Encounter (Signed)
Talked with pt: ok to take antibiotic.

## 2012-08-27 NOTE — Telephone Encounter (Signed)
No answer.  Will call pt back later

## 2012-08-27 NOTE — Progress Notes (Signed)
  Subjective:    Patient ID: Manuel Neal, male    DOB: [birth year], 62 y.o.   MRN: [id number]  HPI 62 year old healthy male pt of Dr. Cyndie Chime seen yesterday for abscess/infected cyst on scrotum presents for close follow up. The abscess had begun to drain and open on its own 2 days ago, so no initial I and D was performed. He was given ceftriaxone x 1 dose yesterday and started on doxycycline, s/p 3 doses and instructed to perform warm compresses.   He reports that after compresses he has noted an firm white material expelled, keratin plug?  The wound is now more of a open hole at this point compaired to yesterday. Draining bloody clear drainage. Tenderness is improving significantly. Redness  Decreased, swelling decreased.  No fever, no N/V, felling well.    Review of Systems  Constitutional: Negative for fever and fatigue.  HENT: Negative for ear pain.   Eyes: Negative for pain.  Respiratory: Negative for shortness of breath.   Cardiovascular: Negative for chest pain.  Gastrointestinal: Negative for abdominal pain.       Objective:   Physical Exam  Constitutional: He appears well-developed and well-nourished.  Eyes: Conjunctivae are normal. Pupils are equal, round, and reactive to light.  Neck: Normal range of motion. Neck supple.  Cardiovascular: Normal rate and regular rhythm.   Pulmonary/Chest: Effort normal and breath sounds normal.  Abdominal: Soft. Bowel sounds are normal.  Genitourinary: Penis normal. No penile tenderness.  Left scrotal sac with area of induration 1/2 inch diameter. No fluctuant area  Minimal surrounding erythema Minimally tender  Lymphadenopathy:       Left: Inguinal adenopathy present.          Assessment & Plan:

## 2012-08-27 NOTE — Assessment & Plan Note (Signed)
Significant improvement of abcess, draining and dreased induration, erythema and pain. No clear indication for further I and D at this point, but if not continuing to improve.. Follow up. Complete antibiotics, continue warm compresses through the weekend.  If fever, not tolerating antibiotics or worsening redness .. Call.  Per pt cyst has gotten infected multiple times in past and really bothers him. I recommended consultation with surgeon for removal once infection resolved.

## 2012-08-27 NOTE — Patient Instructions (Addendum)
Continue warm compresses 3-4 times a day. Complete all antibiotics. If not resolved by early next week, follow up with Dr. Patsy Lager. Consider surgical removal of the cyst when infection cleared given it has given you so much trouble in the past.

## 2012-09-01 ENCOUNTER — Ambulatory Visit (AMBULATORY_SURGERY_CENTER): Payer: Managed Care, Other (non HMO) | Admitting: Internal Medicine

## 2012-09-01 ENCOUNTER — Encounter: Payer: Self-pay | Admitting: Internal Medicine

## 2012-09-01 VITALS — BP 110/64 | HR 61 | Temp 97.0°F | Resp 15 | Ht 72.0 in | Wt 205.0 lb

## 2012-09-01 DIAGNOSIS — K5730 Diverticulosis of large intestine without perforation or abscess without bleeding: Secondary | ICD-10-CM

## 2012-09-01 DIAGNOSIS — Z1211 Encounter for screening for malignant neoplasm of colon: Secondary | ICD-10-CM

## 2012-09-01 DIAGNOSIS — K648 Other hemorrhoids: Secondary | ICD-10-CM

## 2012-09-01 MED ORDER — SODIUM CHLORIDE 0.9 % IV SOLN: 500.0000 mL | INTRAVENOUS | Status: DC

## 2012-09-01 NOTE — Patient Instructions (Addendum)
No polyps or cancer seen - good prep.  You do have diverticulosis and internal hemorrhoids - common conditions that are not usually health problems.  Next routine colonoscopy in 10 years - 2024.  Thank you for choosing me and [facility].  Iva Boop, MD, FACG   YOU HAD AN ENDOSCOPIC PROCEDURE TODAY AT THE [facility]: Refer to the procedure report that was given to you for any specific questions about what was found during the examination.  If the procedure report does not answer your questions, please call your gastroenterologist to clarify.  If you requested that your care partner not be given the details of your procedure findings, then the procedure report has been included in a sealed envelope for you to review at your convenience later.  YOU SHOULD EXPECT: Some feelings of bloating in the abdomen. Passage of more gas than usual.  Walking can help get rid of the air that was put into your GI tract during the procedure and reduce the bloating. If you had a lower endoscopy (such as a colonoscopy or flexible sigmoidoscopy) you may notice spotting of blood in your stool or on the toilet paper. If you underwent a bowel prep for your procedure, then you may not have a normal bowel movement for a few days.  DIET: Your first meal following the procedure should be a light meal and then it is ok to progress to your normal diet.  A half-sandwich or bowl of soup is an example of a good first meal.  Heavy or fried foods are harder to digest and may make you feel nauseous or bloated.  Likewise meals heavy in dairy and vegetables can cause extra gas to form and this can also increase the bloating.  Drink plenty of fluids but you should avoid alcoholic beverages for 24 hours.  ACTIVITY: Your care partner should take you home directly after the procedure.  You should plan to take it easy, moving slowly for the rest of the day.  You can resume normal activity the day after the  procedure however you should NOT DRIVE or use heavy machinery for 24 hours (because of the sedation medicines used during the test).    SYMPTOMS TO REPORT IMMEDIATELY: A gastroenterologist can be reached at any hour.  During normal business hours, 8:30 AM to 5:00 PM Monday through Friday, call [phone].  After hours and on weekends, please call the GI answering service at [phone] who will take a message and have the physician on call contact you.   Following lower endoscopy (colonoscopy or flexible sigmoidoscopy):  Excessive amounts of blood in the stool  Significant tenderness or worsening of abdominal pains  Swelling of the abdomen that is new, acute  Fever of 100F or higher  FOLLOW UP: If any biopsies were taken you will be contacted by phone or by letter within the next 1-3 weeks.  Call your gastroenterologist if you have not heard about the biopsies in 3 weeks.  Our staff will call the home number listed on your records the next business day following your procedure to check on you and address any questions or concerns that you may have at that time regarding the information given to you following your procedure. This is a courtesy call and so if there is no answer at the home number and we have not heard from you through the emergency physician on call, we will assume that you have returned to your regular daily activities without  incident.  SIGNATURES/CONFIDENTIALITY: You and/or your care partner have signed paperwork which will be entered into your electronic medical record.  These signatures attest to the fact that that the information above on your After Visit Summary has been reviewed and is understood.  Full responsibility of the confidentiality of this discharge information lies with you and/or your care-partner.

## 2012-09-01 NOTE — Progress Notes (Signed)
Propofol given over incremental dosagesLidocaine-40mg IV prior to Propofol Induction 

## 2012-09-01 NOTE — Progress Notes (Signed)
NO EGG OR SOY ALLERGY. EWM 

## 2012-09-01 NOTE — Progress Notes (Signed)
Patient did not experience any of the following events: a burn prior to discharge; a fall within the facility; wrong site/side/patient/procedure/implant event; or a hospital transfer or hospital admission upon discharge from the facility. (G8907) Patient did not have preoperative order for IV antibiotic SSI prophylaxis. (G8918)  

## 2012-09-01 NOTE — Op Note (Signed)
[facility] [address] [place], [address]   COLONOSCOPY PROCEDURE REPORT  PATIENT: Manuel, Neal  MR#: [id number] BIRTHDATE: [birth year] , 61  yrs. old GENDER: Male ENDOSCOPIST: Iva Boop, MD, [facility] REFERRED XB:[id number] Ward Chatters, M.D. PROCEDURE DATE:  09/01/2012 PROCEDURE:   Colonoscopy, screening ASA CLASS:   Class II INDICATIONS:average risk screening. MEDICATIONS: propofol (Diprivan) 250mg  IV, MAC sedation, administered by CRNA, and These medications were titrated to patient response per physician's verbal order  DESCRIPTION OF PROCEDURE:   After the risks benefits and alternatives of the procedure were thoroughly explained, informed consent was obtained.  A digital rectal exam revealed no rectal mass.   The LB [id number]-[id number]  endoscope was introduced through the anus and advanced to the cecum, which was identified by both the appendix and ileocecal valve. No adverse events experienced. The quality of the prep was Suprep good  The instrument was then slowly withdrawn as the colon was fully examined.      COLON FINDINGS: Moderate diverticulosis was noted in the sigmoid colon.   Small internal hemorrhoids were found.   The colon mucosa was otherwise normal.   A right colon retroflexion was performed. Retroflexed views revealed internal hemorrhoids. The time to cecum=1 minutes 30 seconds.  Withdrawal time=14 minutes 55 seconds. The scope was withdrawn and the procedure completed. COMPLICATIONS: There were no complications.  ENDOSCOPIC IMPRESSION: 1.   Moderate diverticulosis was noted in the sigmoid colon 2.   Small internal hemorrhoids 3.   The colon mucosa was otherwise normal  RECOMMENDATIONS: Repeat Colonscopy in 10 years.   eSigned:  Iva Boop, MD, [facility] 09/01/2012 9:27 AM   cc: Juleen China, MD and The Patient

## 2012-09-02 ENCOUNTER — Telehealth: Payer: Self-pay | Admitting: *Deleted

## 2012-09-02 NOTE — Telephone Encounter (Signed)
Message left on f/u

## 2013-04-14 ENCOUNTER — Encounter: Payer: Self-pay | Admitting: Family Medicine

## 2013-04-14 ENCOUNTER — Ambulatory Visit (INDEPENDENT_AMBULATORY_CARE_PROVIDER_SITE_OTHER): Payer: Managed Care, Other (non HMO) | Admitting: Family Medicine

## 2013-04-14 VITALS — BP 110/82 | HR 78 | Temp 98.0°F | Ht 72.0 in | Wt 206.5 lb

## 2013-04-14 DIAGNOSIS — T7840XA Allergy, unspecified, initial encounter: Secondary | ICD-10-CM

## 2013-04-14 MED ORDER — DEXAMETHASONE SODIUM PHOSPHATE 100 MG/10ML IJ SOLN: 10.0000 mg | Freq: Once | INTRAMUSCULAR | Status: DC

## 2013-04-14 MED ORDER — DEXAMETHASONE SOD PHOSPHATE PF 10 MG/ML IJ SOLN: 10.0000 mg | Freq: Once | INTRAMUSCULAR | Status: DC

## 2013-04-14 MED ORDER — TRIAMCINOLONE ACETONIDE 0.1 % EX CREA: 1.0000 "application " | TOPICAL_CREAM | Freq: Two times a day (BID) | CUTANEOUS | Status: DC

## 2013-04-14 MED ORDER — DEXAMETHASONE SODIUM PHOSPHATE 10 MG/ML IJ SOLN
10.0000 mg | Freq: Once | INTRAMUSCULAR | Status: AC
  Administered 2013-04-14: 10 mg via INTRAMUSCULAR

## 2013-04-14 NOTE — Progress Notes (Signed)
Date:  04/14/2013   Name:  Manuel Neal   DOB:  1951/01/13   MRN:  [id number] Gender: male Age: 62 y.o.  Primary Physician:  Hannah Beat, MD   Chief Complaint: Allergic Reaction   Subjective:   History of Present Illness:  Manuel Neal is a 62 y.o. very pleasant male patient who presents with the following:  The patient was eating at [facility] seafood 2 days ago, when after eating some broiled fish that had many herbs, he developed some facial redness, some mild swelling around his eyes. He did not develop any neck or tongue swelling. He has been taking Benadryl since that time. He did have a similar episode over the summer when he was in [place].  Past Medical History, Surgical History, Social History, Family History, Problem List, Medications, and Allergies have been reviewed and updated if relevant.  Review of Systems:  GEN: No acute illnesses, no fevers, chills. GI: No n/v/d, eating normally Pulm: No SOB Interactive and getting along well at home.  Otherwise, ROS is as per the HPI.  Objective:   Physical Examination: BP 110/82  Pulse 78  Temp(Src) 98 F (36.7 C) (Oral)  Ht 6' (1.829 m)  Wt 206 lb 8 oz (93.668 kg)  BMI 28.00 kg/m2   GEN: WDWN, NAD, Non-toxic, Alert & Oriented x 3 HEENT: Atraumatic, Normocephalic.  Ears and Nose: No external deformity. EXTR: No clubbing/cyanosis/edema NEURO: Normal gait.  PSYCH: Normally interactive. Conversant. Not depressed or anxious appearing.  Calm demeanor.  Facial redness is noted as well as mild lower lid edema  No results found for this or any previous visit (from the past 2160 hour(s)).  Assessment & Plan:    Allergy, unspecified not elsewhere classified - Plan: dexamethasone (DECADRON) injection 10 mg, DISCONTINUED: dexamethasone Sod Phosphate PF SOLN 10 mg, DISCONTINUED: dexamethasone (DECADRON) injection 10 mg  Allergy of unclear source. Oral antihistamines, Decadron, and he can use some topical  triamcinolone as well.  Patient Instructions  Allergic Reaction:  Zyrtec 10 mg, 1 tablet in the morning  Cimetidine 400 mg, 1 tablet 3 times a day   Orders Today:  No orders of the defined types were placed in this encounter.    New medications, updates to list, dose adjustments: Meds ordered this encounter  Medications  . triamcinolone cream (KENALOG) 0.1 %    Sig: Apply 1 application topically 2 (two) times daily.    Dispense:  454 g    Refill:  1  . DISCONTD: dexamethasone Sod Phosphate PF SOLN 10 mg    Sig:   . DISCONTD: dexamethasone (DECADRON) injection 10 mg    Sig:   . dexamethasone (DECADRON) injection 10 mg    Sig:     Signed,  Meggin Ola T. Khamia Stambaugh, MD, CAQ Sports Medicine  [facility] [address] [place] [address] Phone: [phone] Fax: [phone]  Updated Complete Medication List:   Medication List       This list is accurate as of: 04/14/13  2:09 PM.  Always use your most recent med list.               aspirin 81 MG tablet  Take 81 mg by mouth daily.     AVODART PO  Take by mouth once.     clobetasol cream 0.05 %  Commonly known as:  TEMOVATE  Apply topically 2 (two) times daily.     multivitamin tablet  Take 1 tablet by mouth  daily.     triamcinolone cream 0.1 %  Commonly known as:  KENALOG  Apply 1 application topically 2 (two) times daily.

## 2013-04-14 NOTE — Progress Notes (Signed)
Pre-visit discussion using our clinic review tool. No additional management support is needed unless otherwise documented below in the visit note.  

## 2013-04-14 NOTE — Patient Instructions (Signed)
Allergic Reaction:  Zyrtec 10 mg, 1 tablet in the morning  Cimetidine 400 mg, 1 tablet 3 times a day

## 2013-06-06 ENCOUNTER — Encounter: Payer: Self-pay | Admitting: Family Medicine

## 2013-06-06 ENCOUNTER — Ambulatory Visit (INDEPENDENT_AMBULATORY_CARE_PROVIDER_SITE_OTHER): Payer: 59 | Admitting: Family Medicine

## 2013-06-06 VITALS — BP 130/90 | HR 96 | Temp 97.9°F | Wt 206.5 lb

## 2013-06-06 DIAGNOSIS — R55 Syncope and collapse: Secondary | ICD-10-CM

## 2013-06-06 LAB — CBC WITH DIFFERENTIAL/PLATELET
BASOS PCT: 0.3 % (ref 0.0–3.0)
Basophils Absolute: 0 10*3/uL (ref 0.0–0.1)
EOS PCT: 0.8 % (ref 0.0–5.0)
Eosinophils Absolute: 0.1 10*3/uL (ref 0.0–0.7)
HCT: 51.4 % (ref 39.0–52.0)
HEMOGLOBIN: 17.4 g/dL — AB (ref 13.0–17.0)
LYMPHS ABS: 1.9 10*3/uL (ref 0.7–4.0)
LYMPHS PCT: 22.4 % (ref 12.0–46.0)
MCHC: 33.8 g/dL (ref 30.0–36.0)
MCV: 91.4 fl (ref 78.0–100.0)
MONOS PCT: 9.3 % (ref 3.0–12.0)
Monocytes Absolute: 0.8 10*3/uL (ref 0.1–1.0)
Neutro Abs: 5.6 10*3/uL (ref 1.4–7.7)
Neutrophils Relative %: 67.2 % (ref 43.0–77.0)
Platelets: 284 10*3/uL (ref 150.0–400.0)
RBC: 5.63 Mil/uL (ref 4.22–5.81)
RDW: 13.1 % (ref 11.5–14.6)
WBC: 8.3 10*3/uL (ref 4.5–10.5)

## 2013-06-06 LAB — BASIC METABOLIC PANEL
BUN: 16 mg/dL (ref 6–23)
CALCIUM: 10.1 mg/dL (ref 8.4–10.5)
CO2: 26 mEq/L (ref 19–32)
Chloride: 104 mEq/L (ref 96–112)
Creatinine, Ser: 1.1 mg/dL (ref 0.4–1.5)
GFR: 73.78 mL/min (ref >60.00)
Glucose, Bld: 107 mg/dL — ABNORMAL HIGH (ref 70–99)
POTASSIUM: 4.4 meq/L (ref 3.5–5.1)
SODIUM: 138 meq/L (ref 135–145)

## 2013-06-06 NOTE — Progress Notes (Signed)
Pre-visit discussion using our clinic review tool. No additional management support is needed unless otherwise documented below in the visit note.  H/o AF and Aflutter but much improved after ablation.  ~1 month ago he stood up quickly and then got dizzy, nauseated and vomited. The sx self-resolved. He can get lightheaded occ when he moves quickly from supine to standing.    Today he had a episode of syncope.  He awoke at 6AM, got out of bed, went to bathroom. Sitting on the toilet, he passed out.  Woke up on the floor.  Wife was standing above him.  Was sweaty at the time.  No episodes like that since his ablation several years ago.  Occ L sided sharp chest pain, short duration, happens at rest.  No CP now.  Can exercise w/o CP happening.  Not SOB.  No other sx in the meantime.  Feels okay now except for being sore diffusely from the fall.    He didn't tolerated alpha blockers previously.  Has been on finasteride for about 6 months.  It seems that some of the lightheadedness corresponds to his time on finasteride.  Prostate CA dx noted.   Meds, vitals, and allergies reviewed.   ROS: See HPI.  Otherwise, noncontributory.  GEN: nad, alert and oriented HEENT: mucous membranes moist NECK: supple w/o LA CV: rrr.  no murmur PULM: ctab, no inc wob ABD: soft, +bs EXT: no edema SKIN: no acute rash

## 2013-06-06 NOTE — Patient Instructions (Signed)
Go to the lab on the way out.  We'll contact you with your lab report. I'll notify [facility] about your labs.  Give him an update in a few days.  He may talk to you about changing the finasteride.   Take care.

## 2013-06-07 NOTE — Assessment & Plan Note (Signed)
Unlikely to be from AF/Aflutter.  Likely was a vagal episode during voiding, possibly predisposed by the finasteride.  EKG w/o any noted changes.  Labs unremarkable. Will defer to PCP.  He may need to taper the finasteride, with possible discussion with uro.  Plenty of fluids in meantime.  Okay for outpatient f/u.  He doesn't appear to have any cardiac cause for his CP, as his exercise tolerance is very good. >25 min spent with face to face with patient, >50% counseling and/or coordinating care.

## 2014-05-04 ENCOUNTER — Other Ambulatory Visit (INDEPENDENT_AMBULATORY_CARE_PROVIDER_SITE_OTHER): Payer: 59

## 2014-05-04 DIAGNOSIS — Z131 Encounter for screening for diabetes mellitus: Secondary | ICD-10-CM

## 2014-05-04 DIAGNOSIS — E785 Hyperlipidemia, unspecified: Secondary | ICD-10-CM

## 2014-05-04 DIAGNOSIS — Z79899 Other long term (current) drug therapy: Secondary | ICD-10-CM

## 2014-05-04 LAB — BASIC METABOLIC PANEL
BUN: 19 mg/dL (ref 6–23)
CO2: 27 mEq/L (ref 19–32)
Calcium: 9.3 mg/dL (ref 8.4–10.5)
Chloride: 104 mEq/L (ref 96–112)
Creatinine, Ser: 1.1 mg/dL (ref 0.4–1.5)
GFR: 72.02 mL/min (ref >60.00)
GLUCOSE: 105 mg/dL — AB (ref 70–99)
Potassium: 4.3 mEq/L (ref 3.5–5.1)
SODIUM: 140 meq/L (ref 135–145)

## 2014-05-04 LAB — LIPID PANEL
CHOL/HDL RATIO: 7
Cholesterol: 260 mg/dL — ABNORMAL HIGH (ref 0–200)
HDL: 36 mg/dL — AB (ref >39.00)
NONHDL: 224
Triglycerides: 221 mg/dL — ABNORMAL HIGH (ref 0.0–149.0)
VLDL: 44.2 mg/dL — AB (ref 0.0–40.0)

## 2014-05-04 LAB — CBC WITH DIFFERENTIAL/PLATELET
BASOS PCT: 0.5 % (ref 0.0–3.0)
Basophils Absolute: 0 10*3/uL (ref 0.0–0.1)
EOS ABS: 0.2 10*3/uL (ref 0.0–0.7)
Eosinophils Relative: 2.8 % (ref 0.0–5.0)
HCT: 50.5 % (ref 39.0–52.0)
Hemoglobin: 16.7 g/dL (ref 13.0–17.0)
LYMPHS PCT: 29.1 % (ref 12.0–46.0)
Lymphs Abs: 2 10*3/uL (ref 0.7–4.0)
MCHC: 33.1 g/dL (ref 30.0–36.0)
MCV: 92.1 fl (ref 78.0–100.0)
MONO ABS: 0.7 10*3/uL (ref 0.1–1.0)
Monocytes Relative: 9.7 % (ref 3.0–12.0)
NEUTROS PCT: 57.9 % (ref 43.0–77.0)
Neutro Abs: 4.1 10*3/uL (ref 1.4–7.7)
PLATELETS: 266 10*3/uL (ref 150.0–400.0)
RBC: 5.48 Mil/uL (ref 4.22–5.81)
RDW: 13.1 % (ref 11.5–15.5)
WBC: 7 10*3/uL (ref 4.0–10.5)

## 2014-05-04 LAB — HEPATIC FUNCTION PANEL
ALT: 28 U/L (ref 0–53)
AST: 22 U/L (ref 0–37)
Albumin: 4.2 g/dL (ref 3.5–5.2)
Alkaline Phosphatase: 50 U/L (ref 39–117)
Bilirubin, Direct: 0 mg/dL (ref 0.0–0.3)
TOTAL PROTEIN: 7.1 g/dL (ref 6.0–8.3)
Total Bilirubin: 0.8 mg/dL (ref 0.2–1.2)

## 2014-05-04 LAB — LDL CHOLESTEROL, DIRECT: Direct LDL: 176.7 mg/dL

## 2014-05-11 ENCOUNTER — Ambulatory Visit (INDEPENDENT_AMBULATORY_CARE_PROVIDER_SITE_OTHER): Payer: 59 | Admitting: Family Medicine

## 2014-05-11 ENCOUNTER — Encounter (INDEPENDENT_AMBULATORY_CARE_PROVIDER_SITE_OTHER): Payer: Self-pay

## 2014-05-11 ENCOUNTER — Encounter (HOSPITAL_COMMUNITY): Payer: Self-pay | Admitting: Internal Medicine

## 2014-05-11 VITALS — BP 129/87 | HR 87 | Temp 98.3°F | Ht 71.25 in | Wt 207.5 lb

## 2014-05-11 DIAGNOSIS — E785 Hyperlipidemia, unspecified: Secondary | ICD-10-CM

## 2014-05-11 DIAGNOSIS — Z0000 Encounter for general adult medical examination without abnormal findings: Secondary | ICD-10-CM

## 2014-05-11 MED ORDER — SILDENAFIL CITRATE 20 MG PO TABS: ORAL_TABLET | ORAL | Status: DC

## 2014-05-11 NOTE — Progress Notes (Signed)
Dr. Frederico Hamman T. Dj Senteno, MD, [facility] [place], [address] Phone: [phone] Fax: [phone]  05/11/2014  Patient: Manuel Neal, MRN: [id number], DOB: 01/26/1951, 63 y.o.  Primary Physician:  Owens Loffler, MD  Chief Complaint: Annual Exam  Subjective:   Manuel Neal is a 63 y.o. pleasant patient who presents with the following:  Preventative Health Maintenance Visit:  Health Maintenance Summary Reviewed and updated, unless pt declines services.  Tobacco History Reviewed. Alcohol: No concerns, no excessive use Exercise Habits: Basically every day STD concerns: no risk or activity to increase risk Drug Use: None Encouraged self-testicular check  Health Maintenance  Topic Date Due  . INFLUENZA VACCINE  05/12/2015 (Originally 12/31/2013)  . TETANUS/TDAP  07/07/2018  . COLONOSCOPY  09/02/2022  . ZOSTAVAX  Completed   Immunization History  Administered Date(s) Administered  . Td 07/07/2008  . Zoster 07/13/2012   Chol is up  ED, 80% erectile quality since starting Proscar  Patient Active Problem List   Diagnosis Date Noted  . Hyperlipidemia 05/11/2014  . Allergy, unspecified not elsewhere classified 04/14/2013  . Cancer of prostate with low recurrence risk (stage T1-2a and Gleason < 7 and PSA < 10) 08/26/2012  . BPH (benign prostatic hyperplasia) 08/04/2011  . ACTINIC KERATOSIS, EAR, LEFT 05/17/2010  . PSORIASIS 07/07/2008  . Atrial fibrillation /Atrial flutter 07/07/2008   Past Medical History  Diagnosis Date  . Atrial fibrillation /Atrial flutter 07/07/2008  . Skin cancer 01/14/2010  . Nocturia 05/06/2010  . Keratosis     actinic, left ear  . Warts     multiple  . Prostate cancer     march/2014  . Psoriasis   . BPH (benign prostatic hyperplasia)   . Bone spur     RT great toe   Past Surgical History  Procedure Laterality Date  . Bone spur      right foot  . Tee without cardioversion   08/05/2011    Procedure: TRANSESOPHAGEAL ECHOCARDIOGRAM (TEE);  Surgeon: Darden Amber., MD;  Location: [facility] ENDOSCOPY;  Service: Cardiovascular;  Laterality: N/A;  . Atrial fibrillation and atrial flutter ablation  08/05/11    PVI and CTI ablation by Dr Rayann Heman  . Colonoscopy    . Atrial fibrillation ablation N/A 08/05/2011    Procedure: ATRIAL FIBRILLATION ABLATION;  Surgeon: Thompson Grayer, MD;  Location: [facility] CATH LAB;  Service: Cardiovascular;  Laterality: N/A;   History   Social History  . Marital Status: Married    Spouse Name: N/A    Number of Children: N/A  . Years of Education: N/A   Occupational History  . Not on file.   Social History Main Topics  . Smoking status: Never Smoker   . Smokeless tobacco: Never Used  . Alcohol Use: Yes     Comment: rare  . Drug Use: No  . Sexual Activity: Not on file   Other Topics Concern  . Not on file   Social History Narrative   Lives in [place] with spouse.   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 Cancer Mother     bile duct  . Colon cancer Neg Hx   . Stomach cancer Neg Hx    No Known Allergies  Medication list has been reviewed and updated.   General:  Denies fever, chills, sweats. No significant weight loss. Eyes: Denies blurring,significant itching ENT: Denies earache, sore throat, and hoarseness. Cardiovascular: Denies chest pains, palpitations, dyspnea on exertion Respiratory: Denies cough, dyspnea at rest,wheeezing Breast: no concerns about lumps GI: Denies nausea, vomiting, diarrhea, constipation, change in bowel habits, abdominal pain, melena, hematochezia GU: Denies penile discharge, ED, urinary flow / outflow problems. No STD concerns. Musculoskeletal: Denies back pain, joint pain Derm: Denies rash, itching Neuro: Denies  paresthesias, frequent falls, frequent  headaches Psych: Denies depression, anxiety Endocrine: Denies cold intolerance, heat intolerance, polydipsia Heme: Denies enlarged lymph nodes Allergy: No hayfever  Objective:   BP 129/87 mmHg  Pulse 87  Temp(Src) 98.3 F (36.8 C) (Oral)  Ht 5' 11.25" (1.81 m)  Wt 207 lb 8 oz (94.121 kg)  BMI 28.73 kg/m2 Ideal Body Weight: Weight in (lb) to have BMI = 25: 180.1  No exam data present  GEN: well developed, well nourished, no acute distress Eyes: conjunctiva and lids normal, PERRLA, EOMI ENT: TM clear, nares clear, oral exam WNL Neck: supple, no lymphadenopathy, no thyromegaly, no JVD Pulm: clear to auscultation and percussion, respiratory effort normal CV: regular rate and rhythm, S1-S2, no murmur, rub or gallop, no bruits, peripheral pulses normal and symmetric, no cyanosis, clubbing, edema or varicosities GI: soft, non-tender; no hepatosplenomegaly, masses; active bowel sounds all quadrants GU: no hernia, testicular mass, penile discharge Lymph: no cervical, axillary or inguinal adenopathy MSK: gait normal, muscle tone and strength WNL, no joint swelling, effusions, discoloration, crepitus  SKIN: clear, good turgor, color WNL, no rashes, lesions, or ulcerations Neuro: normal mental status, normal strength, sensation, and motion Psych: alert; oriented to person, place and time, normally interactive and not anxious or depressed in appearance. All labs reviewed with patient.  Lipids:    Component Value Date/Time   CHOL 260* 05/04/2014 0923   TRIG 221.0* 05/04/2014 0923   HDL 36.00* 05/04/2014 0923   LDLDIRECT 176.7 05/04/2014 0923   VLDL 44.2* 05/04/2014 0923   CHOLHDL 7 05/04/2014 0923   CBC: CBC Latest Ref Rng 05/04/2014 06/06/2013 07/01/2012  WBC 4.0 - 10.5 K/uL 7.0 8.3 6.4  Hemoglobin 13.0 - 17.0 g/dL 16.7 17.4(H) 16.7  Hematocrit 39.0 - 52.0 % 50.5 51.4 49.8  Platelets 150.0 - 400.0 K/uL 266.0 284.0 081.4    Basic Metabolic Panel:    Component Value Date/Time   NA  140 05/04/2014 0923   K 4.3 05/04/2014 0923   CL 104 05/04/2014 0923   CO2 27 05/04/2014 0923   BUN 19 05/04/2014 0923   CREATININE 1.1 05/04/2014 0923   CREATININE 1.14 08/01/2011 1532   GLUCOSE 105* 05/04/2014 0923   CALCIUM 9.3 05/04/2014 0923   Hepatic Function Latest Ref Rng 05/04/2014 07/01/2012 05/29/2011  Total Protein 6.0 - 8.3 g/dL 7.1 7.0 7.0  Albumin 3.5 - 5.2 g/dL 4.2 4.3 4.4  AST 0 - 37 U/L _0 ALT 0 - 53 U/L 28 29 37  Alk Phosphatase 39 - 117 U/L 50 51 54  Total Bilirubin 0.2 - 1.2 mg/dL 0.8 0.9 0.9  Bilirubin, Direct 0.0 - 0.3 mg/dL 0.0 0.1 0.1    Lab Results  Component Value Date   TSH 2.92 05/29/2011   Lab Results  Component Value Date   PSA 4.22* 07/01/2012   PSA 3.75 07/17/2011   PSA 5.45* 05/29/2011    Assessment and Plan:   Healthcare maintenance  Hyperlipidemia  Health Maintenance Exam: The patient's preventative maintenance and recommended screening tests for an annual  wellness exam were reviewed in full today. Brought up to date unless services declined.  Counselled on the importance of diet, exercise, and its role in overall health and mortality. The patient's FH and SH was reviewed, including their home life, tobacco status, and drug and alcohol status.  Start Red yeast rice for chol.  Follow-up: No Follow-up on file. Unless noted, follow-up in 1 year for Health Maintenance Exam.  New Prescriptions   SILDENAFIL (REVATIO) 20 MG TABLET    1-5 tablets 30 minutes before intercourse as discussed   No orders of the defined types were placed in this encounter.    Signed,  Maud Deed. Zoella Roberti, MD   Patient's Medications  New Prescriptions   SILDENAFIL (REVATIO) 20 MG TABLET    1-5 tablets 30 minutes before intercourse as discussed  Previous Medications   ASPIRIN 81 MG TABLET    Take 81 mg by mouth daily.   CLOBETASOL CREAM (TEMOVATE) 0.05 %    Apply topically 2 (two) times daily.   FINASTERIDE (PROSCAR) 5 MG TABLET    Take 5 mg by  mouth every other day.    MULTIPLE VITAMIN (MULTIVITAMIN) TABLET    Take 1 tablet by mouth 2 (two) times daily.    NON FORMULARY    [employer]     Take 2 tablets at each meal.   (Made from 55 raw fruits and vegetables)   NON FORMULARY    Graviola     Take 2 tablets in the morning and 2 tablets at night.  Modified Medications   No medications on file  Discontinued Medications   NEOMYCIN-POLYMYXIN B-DEXAMETHASONE (MAXITROL) 3.5-10000-0.1 SUSP       SAW PALMETTO 160 MG CAPSULE    Take 160 mg by mouth 2 (two) times daily.   TOBRAMYCIN (TOBREX) 0.3 % OPHTHALMIC SOLUTION       TRIAMCINOLONE CREAM (KENALOG) 0.1 %    Apply 1 application topically 2 (two) times daily.

## 2014-05-11 NOTE — Progress Notes (Signed)
Pre visit review using our clinic review tool, if applicable. No additional management support is needed unless otherwise documented below in the visit note. 

## 2014-11-30 ENCOUNTER — Ambulatory Visit (INDEPENDENT_AMBULATORY_CARE_PROVIDER_SITE_OTHER): Payer: 59 | Admitting: Family Medicine

## 2014-11-30 ENCOUNTER — Encounter: Payer: Self-pay | Admitting: Family Medicine

## 2014-11-30 VITALS — BP 110/80 | HR 91 | Temp 98.4°F | Wt 214.5 lb

## 2014-11-30 DIAGNOSIS — T148 Other injury of unspecified body region: Secondary | ICD-10-CM

## 2014-11-30 DIAGNOSIS — W57XXXA Bitten or stung by nonvenomous insect and other nonvenomous arthropods, initial encounter: Secondary | ICD-10-CM | POA: Diagnosis not present

## 2014-11-30 MED ORDER — DOXYCYCLINE HYCLATE 100 MG PO TABS: 100.0000 mg | ORAL_TABLET | Freq: Two times a day (BID) | ORAL | Status: DC

## 2014-11-30 NOTE — Patient Instructions (Addendum)
Keep the area clean and covered. You can try warm compresses in the meantime.  Use neosporin on the area.  If any fever, more redness, or other rash, then start the antibiotics.   Take care.  Glad to see you.

## 2014-11-30 NOTE — Progress Notes (Signed)
Pre visit review using our clinic review tool, if applicable. No additional management support is needed unless otherwise documented below in the visit note.  Mowed grass recently, felt like something stung him.  Itched for a few days.  The "scab" didn't come off, noted it was a tick.  Pulled with tweezers but it broke apart.  The tick wasn't engorged.   Meds, vitals, and allergies reviewed.   ROS: See HPI.  Otherwise, noncontributory.  nad L side of back with scab noted.   Area cleaned with etoh, lifted and removed scab with 18g needle.  Some likely tick remnant removed, but unclear if all totally removed- d/w pt.  Further exploration likely would increase risk of infection and may delay healing.  See plan.

## 2014-12-01 DIAGNOSIS — W57XXXA Bitten or stung by nonvenomous insect and other nonvenomous arthropods, initial encounter: Secondary | ICD-10-CM | POA: Insufficient documentation

## 2014-12-01 NOTE — Assessment & Plan Note (Signed)
Not engorged.   Some likely tick remnant removed, but unclear if all totally removed- d/w pt.  Further exploration likely would increase risk of infection and may delay healing. Minimal local irritation.  Feels well o/w.  No fevers or other rash.  Covered with bandaid and neosporin.  Hold rx for doxy, use if spreading redness or any other sx.   He agrees.  Okay for outpatient f/u.   Routed to PCP as FYI.

## 2015-04-16 ENCOUNTER — Telehealth: Payer: Self-pay | Admitting: Family Medicine

## 2015-04-16 NOTE — Telephone Encounter (Signed)
Pt dropped off labs completed at [place].  He would like Dr. Lorelei Pont to evaluate and possibly cancel CPE labs on 05/09/15 is these labs results are adequate.  Patient requests callback at [phone] to inform.  Results left in Dr. Lillie Fragmin inbox

## 2015-04-17 NOTE — Telephone Encounter (Signed)
ok 

## 2015-04-23 ENCOUNTER — Encounter: Payer: Self-pay | Admitting: Family Medicine

## 2015-04-23 ENCOUNTER — Ambulatory Visit (INDEPENDENT_AMBULATORY_CARE_PROVIDER_SITE_OTHER): Payer: 59 | Admitting: Family Medicine

## 2015-04-23 ENCOUNTER — Ambulatory Visit (INDEPENDENT_AMBULATORY_CARE_PROVIDER_SITE_OTHER)
Admission: RE | Admit: 2015-04-23 | Discharge: 2015-04-23 | Disposition: A | Discharge status: Home / Self Care | Payer: 59 | Source: Ambulatory Visit | Attending: Family Medicine | Admitting: Family Medicine

## 2015-04-23 VITALS — BP 118/84 | HR 78 | Temp 98.3°F | Ht 71.25 in | Wt 209.5 lb

## 2015-04-23 DIAGNOSIS — M545 Low back pain, unspecified: Secondary | ICD-10-CM

## 2015-04-23 MED ORDER — CYCLOBENZAPRINE HCL 10 MG PO TABS: 5.0000 mg | ORAL_TABLET | Freq: Three times a day (TID) | ORAL | Status: DC | PRN

## 2015-04-23 MED ORDER — MELOXICAM 15 MG PO TABS: 15.0000 mg | ORAL_TABLET | Freq: Every day | ORAL | Status: DC | PRN

## 2015-04-23 NOTE — Progress Notes (Signed)
Pre visit review using our clinic review tool, if applicable. No additional management support is needed unless otherwise documented below in the visit note. 

## 2015-04-23 NOTE — Patient Instructions (Signed)
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

## 2015-04-23 NOTE — Progress Notes (Signed)
Subjective:    Patient ID: Manuel Neal, male    DOB: 04/10/1951, 64 y.o.   MRN: [id number]  HPI  Here with back pain   Was exercising Thurs eve - dead lifting (pulling straight up)  Felt a pop in L lumbar area  Not getting better like it it usually does Pain is not shooting down leg - just low back and upper buttock Ice and heat give temporary relief  Took some advil (took one)  Anything can trigger pain -esp trying to get up from bed  Sitting can make it worse  Has been able to sleep on his side with leg/knees flexed   Has had back "go out" before and he can usually stretch it out   No n/t or weakness in feet or legs  No foot drop  No bowel or bladder change   Patient Active Problem List   Diagnosis Date Noted  . Tick bite 12/01/2014  . Hyperlipidemia 05/11/2014  . Allergy, unspecified not elsewhere classified 04/14/2013  . Cancer of prostate with low recurrence risk (stage T1-2a and Gleason < 7 and PSA < 10) ([place]) 08/26/2012  . BPH (benign prostatic hyperplasia) 08/04/2011  . ACTINIC KERATOSIS, EAR, LEFT 05/17/2010  . PSORIASIS 07/07/2008  . Atrial fibrillation /Atrial flutter 07/07/2008   Past Medical History  Diagnosis Date  . Atrial fibrillation /Atrial flutter 07/07/2008  . Skin cancer 01/14/2010  . Keratosis     actinic, left ear  . Prostate cancer ([place])     march/2014  . Psoriasis   . BPH (benign prostatic hyperplasia)   . Bone spur     RT great toe   Past Surgical History  Procedure Laterality Date  . Bone spur      right foot  . Tee without cardioversion  08/05/2011    Procedure: TRANSESOPHAGEAL ECHOCARDIOGRAM (TEE);  Surgeon: Darden Amber., MD;  Location: [facility] ENDOSCOPY;  Service: Cardiovascular;  Laterality: N/A;  . Atrial fibrillation and atrial flutter ablation  08/05/11    PVI and CTI ablation by Dr Rayann Heman  . Colonoscopy    . Atrial fibrillation ablation N/A 08/05/2011    Procedure: ATRIAL FIBRILLATION ABLATION;  Surgeon: Thompson Grayer, MD;   Location: [facility] CATH LAB;  Service: Cardiovascular;  Laterality: N/A;   Social History  Substance Use Topics  . Smoking status: Never Smoker   . Smokeless tobacco: Never Used  . Alcohol Use: 0.0 oz/week    0 Standard drinks or equivalent per week     Comment: rare   Family History  Problem Relation Age of Onset  . Cancer Mother     bile duct  . Colon cancer Neg Hx   . Stomach cancer Neg Hx    No Known Allergies Current Outpatient Prescriptions on File Prior to Visit  Medication Sig Dispense Refill  . aspirin 81 MG tablet Take 81 mg by mouth daily.    . clobetasol cream (TEMOVATE) 0.05 % Apply topically 2 (two) times daily. 60 g 2  . finasteride (PROSCAR) 5 MG tablet Take 5 mg by mouth 2 (two) times a week.     . Multiple Vitamin (MULTIVITAMIN) tablet Take 1 tablet by mouth 2 (two) times daily.     . NON FORMULARY [employer]     Take 2 tablets at each meal.   (Made from 55 raw fruits and vegetables)    . NON FORMULARY Graviola     Take 2 tablets in the morning and 2 tablets at  night.    . Red Yeast Rice Extract (RED YEAST RICE PO) Take 2 tablets by mouth 2 (two) times daily.    . sildenafil (REVATIO) 20 MG tablet 1-5 tablets 30 minutes before intercourse as discussed 10 tablet 0   No current facility-administered medications on file prior to visit.      Review of Systems Review of Systems  Constitutional: Negative for fever, appetite change, fatigue and unexpected weight change.  Eyes: Negative for pain and visual disturbance.  Respiratory: Negative for cough and shortness of breath.   Cardiovascular: Negative for cp or palpitations    Gastrointestinal: Negative for nausea, diarrhea and constipation.  Genitourinary: Negative for urgency and frequency.  Skin: Negative for pallor or rash   MSK pos for back pain , neg for joint swelling  Neurological: Negative for weakness, light-headedness, numbness and headaches.  Hematological: Negative for adenopathy. Does not  bruise/bleed easily.  Psychiatric/Behavioral: Negative for dysphoric mood. The patient is not nervous/anxious.         Objective:   Physical Exam  Constitutional: He appears well-developed and well-nourished. No distress.  Muscular/well developed and uncomfortable with back pain today  HENT:  Head: Normocephalic and atraumatic.  Eyes: Conjunctivae and EOM are normal. Pupils are equal, round, and reactive to light. No scleral icterus.  Neck: Normal range of motion. Neck supple.  Cardiovascular: Normal rate and regular rhythm.   Pulmonary/Chest: Effort normal and breath sounds normal. He has no wheezes. He has no rales.  Abdominal: Soft. Bowel sounds are normal. He exhibits no distension. There is no tenderness.  Musculoskeletal: He exhibits tenderness.       Lumbar back: He exhibits decreased range of motion, tenderness and spasm. He exhibits no bony tenderness and no edema.  Spasm of L lumbar musculature with tenderness No bony tenderness  Flex 40 deg Ext 10 deg Med/lat bend -full   Labored gait with a cane today  No neurol def Neg bent knee raise - causes back but not leg pain   Lymphadenopathy:    He has no cervical adenopathy.  Neurological: He is alert. He has normal strength and normal reflexes. He displays no atrophy. No cranial nerve deficit or sensory deficit. He exhibits normal muscle tone. Coordination normal.  Negative SLR  Skin: Skin is warm and dry. No rash noted. No erythema. No pallor.  Psychiatric: He has a normal mood and affect.          Assessment & Plan:   Problem List Items Addressed This Visit      Other   Low back pain - Primary    L sided spasms that can be intense with positional change  Enc use of heat Enc walking  meloxicam and flexeril px with caution xr of LS today and then review  Disc back mechanics -esp with lifting Sport med handout with exercises given       Relevant Medications   meloxicam (MOBIC) 15 MG tablet    cyclobenzaprine (FLEXERIL) 10 MG tablet   Other Relevant Orders   DG Lumbar Spine Complete

## 2015-04-23 NOTE — Assessment & Plan Note (Signed)
L sided spasms that can be intense with positional change  Enc use of heat Enc walking  meloxicam and flexeril px with caution xr of LS today and then review  Disc back mechanics -esp with lifting Sport med handout with exercises given

## 2015-04-30 ENCOUNTER — Telehealth: Payer: Self-pay | Admitting: Family Medicine

## 2015-04-30 NOTE — Telephone Encounter (Signed)
Patient dropped off his labs from work last week.  Patient wants to know if he still has to have lab work done before his physical appointment.

## 2015-05-01 NOTE — Telephone Encounter (Signed)
i looked at them - should be ok with what we have

## 2015-05-01 NOTE — Telephone Encounter (Signed)
Manuel Neal notified as instructed by telephone.  Lab appointment scheduled prior to his CPE appointment cancelled.

## 2015-05-09 ENCOUNTER — Other Ambulatory Visit: Payer: 59

## 2015-05-16 ENCOUNTER — Ambulatory Visit (INDEPENDENT_AMBULATORY_CARE_PROVIDER_SITE_OTHER): Payer: 59 | Admitting: Family Medicine

## 2015-05-16 ENCOUNTER — Encounter: Payer: Self-pay | Admitting: Family Medicine

## 2015-05-16 VITALS — BP 120/82 | HR 84 | Temp 97.4°F | Ht 71.5 in | Wt 209.0 lb

## 2015-05-16 DIAGNOSIS — Z0000 Encounter for general adult medical examination without abnormal findings: Secondary | ICD-10-CM

## 2015-05-16 NOTE — Progress Notes (Signed)
Dr. Frederico Hamman T. Sylvia Kondracki, MD, [facility] and [facility] Medicine [place], [address] Phone: [phone] Fax: [phone]  05/16/2015  Patient: Manuel Neal, MRN: [id number], DOB: 01-May-1951, 64 y.o.  Primary Physician:  Owens Loffler, MD   Chief Complaint  Patient presents with  . Annual Exam   Subjective:   CAMARON Neal is a 64 y.o. pleasant patient who presents with the following:  Preventative Health Maintenance Visit:  No c/o Chol down 50 points on RYR  Health Maintenance Summary Reviewed and updated, unless pt declines services.  Tobacco History Reviewed. Alcohol: No concerns, no excessive use Exercise Habits: Working out regularly STD concerns: no risk or activity to increase risk Drug Use: None Encouraged self-testicular check  Health Maintenance  Topic Date Due  . Hepatitis C Screening  February 18, 1951  . HIV Screening  01/05/1967  . INFLUENZA VACCINE  08/31/2015 (Originally 01/01/2015)  . TETANUS/TDAP  07/07/2018  . COLONOSCOPY  09/02/2022  . ZOSTAVAX  Completed   Immunization History  Administered Date(s) Administered  . Td 07/07/2008  . Zoster 07/13/2012   Patient Active Problem List   Diagnosis Date Noted  . Low back pain 04/23/2015  . Hyperlipidemia 05/11/2014  . Allergy, unspecified not elsewhere classified 04/14/2013  . Cancer of prostate with low recurrence risk (stage T1-2a and Gleason < 7 and PSA < 10) ([place]) 08/26/2012  . BPH (benign prostatic hyperplasia) 08/04/2011  . ACTINIC KERATOSIS, EAR, LEFT 05/17/2010  . PSORIASIS 07/07/2008  . Atrial fibrillation /Atrial flutter 07/07/2008   Past Medical History  Diagnosis Date  . Atrial fibrillation /Atrial flutter 07/07/2008  . Skin cancer 01/14/2010  . Keratosis     actinic, left ear  . Prostate cancer ([place])     march/2014  . Psoriasis   . BPH (benign prostatic hyperplasia)   . Bone spur     RT great toe   Past Surgical History  Procedure Laterality  Date  . Bone spur      right foot  . Tee without cardioversion  08/05/2011    Procedure: TRANSESOPHAGEAL ECHOCARDIOGRAM (TEE);  Surgeon: Darden Amber., MD;  Location: [facility] ENDOSCOPY;  Service: Cardiovascular;  Laterality: N/A;  . Atrial fibrillation and atrial flutter ablation  08/05/11    PVI and CTI ablation by Dr Rayann Heman  . Colonoscopy    . Atrial fibrillation ablation N/A 08/05/2011    Procedure: ATRIAL FIBRILLATION ABLATION;  Surgeon: Thompson Grayer, MD;  Location: [facility] C[facility] LAB;  Service: Cardiovascular;  Laterality: N/A;   Social History   Social History  . Marital Status: Married    Spouse Name: N/A  . Number of Children: N/A  . Years of Education: N/A   Occupational History  . BROADCAST    Social History Main Topics  . Smoking status: Never Smoker   . Smokeless tobacco: Never Used  . Alcohol Use: 0.0 oz/week    0 Standard drinks or equivalent per week     Comment: rare  . Drug Use: No  . Sexual Activity:    Partners: Female   Other Topics Concern  . Not on file   Social History Narrative   Lives in [place] with spouse.   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 Cancer Mother  bile duct  . Colon cancer Neg Hx   . Stomach cancer Neg Hx    No Known Allergies  Medication list has been reviewed and updated.   General: Denies fever, chills, sweats. No significant weight loss. Eyes: Denies blurring,significant itching ENT: Denies earache, sore throat, and hoarseness. Cardiovascular: Denies chest pains, palpitations, dyspnea on exertion Respiratory: Denies cough, dyspnea at rest,wheeezing Breast: no concerns about lumps GI: Denies nausea, vomiting, diarrhea, constipation, change in bowel habits, abdominal pain, melena, hematochezia GU: Denies penile discharge, ED, urinary flow /  outflow problems. No STD concerns. Musculoskeletal: Denies back pain, joint pain Derm: Denies rash, itching Neuro: Denies  paresthesias, frequent falls, frequent headaches Psych: Denies depression, anxiety Endocrine: Denies cold intolerance, heat intolerance, polydipsia Heme: Denies enlarged lymph nodes Allergy: No hayfever  Objective:   BP 120/82 mmHg  Pulse 84  Temp(Src) 97.4 F (36.3 C) (Oral)  Ht 5' 11.5" (1.816 m)  Wt 209 lb (94.802 kg)  BMI 28.75 kg/m2 Ideal Body Weight: Weight in (lb) to have BMI = 25: 181.4  No exam data present  GEN: well developed, well nourished, no acute distress Eyes: conjunctiva and lids normal, PERRLA, EOMI ENT: TM clear, nares clear, oral exam WNL Neck: supple, no lymphadenopathy, no thyromegaly, no JVD Pulm: clear to auscultation and percussion, respiratory effort normal CV: regular rate and rhythm, S1-S2, no murmur, rub or gallop, no bruits, peripheral pulses normal and symmetric, no cyanosis, clubbing, edema or varicosities GI: soft, non-tender; no hepatosplenomegaly, masses; active bowel sounds all quadrants GU: no hernia, testicular mass, penile discharge Lymph: no cervical, axillary or inguinal adenopathy MSK: gait normal, muscle tone and strength WNL, no joint swelling, effusions, discoloration, crepitus  SKIN: clear, good turgor, color WNL, no rashes, lesions, or ulcerations Neuro: normal mental status, normal strength, sensation, and motion Psych: alert; oriented to person, place and time, normally interactive and not anxious or depressed in appearance.  Assessment and Plan:   Healthcare maintenance  Health Maintenance Exam: The patient's preventative maintenance and recommended screening tests for an annual wellness exam were reviewed in full today. Brought up to date unless services declined.  Counselled on the importance of diet, exercise, and its role in overall health and mortality. The patient's FH and SH was reviewed,  including their home life, tobacco status, and drug and alcohol status.  Doing great, chol down 50 points  Follow-up: No Follow-up on file. Unless noted, follow-up in 1 year for Health Maintenance Exam.  Signed,  Frederico Hamman T. Yadriel Kerrigan, MD   Patient's Medications  New Prescriptions   No medications on file  Previous Medications   ASPIRIN 81 MG TABLET    Take 81 mg by mouth daily.   CLOBETASOL CREAM (TEMOVATE) 0.05 %    Apply topically 2 (two) times daily.   FINASTERIDE (PROSCAR) 5 MG TABLET    Take 5 mg by mouth 2 (two) times a week.    MULTIPLE VITAMIN (MULTIVITAMIN) TABLET    Take 1 tablet by mouth 2 (two) times daily.    NON FORMULARY    [employer]     Take 2 tablets at each meal.   (Made from 55 raw fruits and vegetables)   RED YEAST RICE EXTRACT (RED YEAST RICE PO)    Take 2 tablets by mouth 2 (two) times daily.   SILDENAFIL (REVATIO) 20 MG TABLET    1-5 tablets 30 minutes before intercourse as discussed  Modified Medications   No medications on file  Discontinued Medications   CYCLOBENZAPRINE (  FLEXERIL) 10 MG TABLET    Take 0.5-1 tablets (5-10 mg total) by mouth 3 (three) times daily as needed for muscle spasms.   MELOXICAM (MOBIC) 15 MG TABLET    Take 1 tablet (15 mg total) by mouth daily as needed for pain. With food   NON FORMULARY    [place]     Take 2 tablets in the morning and 2 tablets at night.

## 2015-05-16 NOTE — Progress Notes (Signed)
Pre visit review using our clinic review tool, if applicable. No additional management support is needed unless otherwise documented below in the visit note. 

## 2015-11-24 ENCOUNTER — Other Ambulatory Visit: Payer: Self-pay | Admitting: Family Medicine

## 2015-12-03 ENCOUNTER — Ambulatory Visit (INDEPENDENT_AMBULATORY_CARE_PROVIDER_SITE_OTHER): Payer: 59 | Admitting: Family Medicine

## 2015-12-03 ENCOUNTER — Encounter: Payer: Self-pay | Admitting: Family Medicine

## 2015-12-03 VITALS — BP 125/93 | HR 82 | Temp 98.4°F | Ht 71.5 in | Wt 211.0 lb

## 2015-12-03 DIAGNOSIS — R21 Rash and other nonspecific skin eruption: Secondary | ICD-10-CM

## 2015-12-03 NOTE — Progress Notes (Signed)
Pre visit review using our clinic review tool, if applicable. No additional management support is needed unless otherwise documented below in the visit note. 

## 2015-12-03 NOTE — Progress Notes (Signed)
Dr. Frederico Hamman T. Eleina Jergens, MD, [facility] and [facility] Medicine [place], [address] Phone: [phone] Fax: [phone]  12/03/2015  Patient: Manuel Neal, MRN: [id number], DOB: 1950/10/20, 65 y.o.  Primary Physician:  Owens Loffler, MD   Chief Complaint  Patient presents with  . Insect Bite    Tick Bite x 1 month ago   Subjective:   Manuel Neal is a 65 y.o. very pleasant male patient who presents with the following:  Pulled off small tick.  Really itchy.  ? Warmth 1 month ago  No systemic symptoms. Shows me a picture of R thigh - some flat pinkish appearance near old tick bite.   Past Medical History, Surgical History, Social History, Family History, Problem List, Medications, and Allergies have been reviewed and updated if relevant.  Patient Active Problem List   Diagnosis Date Noted  . Low back pain 04/23/2015  . Hyperlipidemia 05/11/2014  . Allergy, unspecified not elsewhere classified 04/14/2013  . Cancer of prostate with low recurrence risk (stage T1-2a and Gleason < 7 and PSA < 10) ([place]) 08/26/2012  . BPH (benign prostatic hyperplasia) 08/04/2011  . ACTINIC KERATOSIS, EAR, LEFT 05/17/2010  . PSORIASIS 07/07/2008  . Atrial fibrillation /Atrial flutter 07/07/2008    Past Medical History  Diagnosis Date  . Atrial fibrillation /Atrial flutter 07/07/2008  . Skin cancer 01/14/2010  . Keratosis     actinic, left ear  . Prostate cancer ([place])     march/2014  . Psoriasis   . BPH (benign prostatic hyperplasia)   . Bone spur     RT great toe    Past Surgical History  Procedure Laterality Date  . Bone spur      right foot  . Tee without cardioversion  08/05/2011    Procedure: TRANSESOPHAGEAL ECHOCARDIOGRAM (TEE);  Surgeon: Darden Amber., MD;  Location: [facility] ENDOSCOPY;  Service: Cardiovascular;  Laterality: N/A;  . Atrial fibrillation and atrial flutter ablation  08/05/11    PVI and CTI ablation by Dr Rayann Heman  .  Colonoscopy    . Atrial fibrillation ablation N/A 08/05/2011    Procedure: ATRIAL FIBRILLATION ABLATION;  Surgeon: Thompson Grayer, MD;  Location: [facility] CATH LAB;  Service: Cardiovascular;  Laterality: N/A;    Social History   Social History  . Marital Status: Married    Spouse Name: N/A  . Number of Children: N/A  . Years of Education: N/A   Occupational History  . BROADCAST    Social History Main Topics  . Smoking status: Never Smoker   . Smokeless tobacco: Never Used  . Alcohol Use: 0.0 oz/week    0 Standard drinks or equivalent per week     Comment: rare  . Drug Use: No  . Sexual Activity:    Partners: Female   Other Topics Concern  . Not on file   Social History Narrative   Lives in [place] with spouse.   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 Cancer Mother     bile duct  . Colon cancer Neg Hx   . Stomach cancer Neg Hx     No Known Allergies  Medication list reviewed and updated in full in [place].   GEN: No acute illnesses, no fevers, chills.  GI: No n/v/d, eating normally Pulm: No SOB Interactive and getting along well at home.  Otherwise, ROS is as per the HPI.  Objective:   BP 125/93 mmHg  Pulse 82  Temp(Src) 98.4 F (36.9 C) (Oral)  Ht 5' 11.5" (1.816 m)  Wt 211 lb (95.709 kg)  BMI 29.02 kg/m2  GEN: WDWN, NAD, Non-toxic, A & O x 3 HEENT: Atraumatic, Normocephalic. Neck supple. No masses, No LAD. Ears and Nose: No external deformity. EXTR: No c/c/e NEURO Normal gait.  PSYCH: Normally interactive. Conversant. Not depressed or anxious appearing.  Calm demeanor.   Silver dollar sized area, slightly raised. No warmth. No pain. R thigh. Somewhat hardened.   Laboratory and Imaging Data:  Assessment and Plan:   Rash and nonspecific skin  eruption  Post-cellulitis rash and scar, itchy Clobetasol bid - has at home  No sign of tick borne illness  Follow-up: No Follow-up on file.  Signed,  Maud Deed. Zanetta Dehaan, MD   Patient's Medications  New Prescriptions   No medications on file  Previous Medications   ASPIRIN 81 MG TABLET    Take 81 mg by mouth daily.   CLOBETASOL CREAM (TEMOVATE) 0.05 %    Apply topically 2 (two) times daily.   FINASTERIDE (PROSCAR) 5 MG TABLET    Take 5 mg by mouth 2 (two) times a week.    MULTIPLE VITAMIN (MULTIVITAMIN) TABLET    Take 1 tablet by mouth 2 (two) times daily.    NON FORMULARY    [employer]     Take 2 tablets at each meal.   (Made from 55 raw fruits and vegetables)   RED YEAST RICE EXTRACT (RED YEAST RICE PO)    Take 2 tablets by mouth 2 (two) times daily.   SILDENAFIL (REVATIO) 20 MG TABLET    TAKE 1-5 TABLETS BY MOUTH 30 MINUTES BEFORE INTERCOURSE AS DISCUSSED  Modified Medications   No medications on file  Discontinued Medications   No medications on file

## 2016-05-09 ENCOUNTER — Encounter: Payer: Self-pay | Admitting: Primary Care

## 2016-05-09 ENCOUNTER — Ambulatory Visit (INDEPENDENT_AMBULATORY_CARE_PROVIDER_SITE_OTHER): Payer: 59 | Admitting: Primary Care

## 2016-05-09 ENCOUNTER — Telehealth: Payer: Self-pay | Admitting: Family Medicine

## 2016-05-09 VITALS — BP 150/100 | HR 80 | Temp 98.0°F | Ht 71.5 in | Wt 208.8 lb

## 2016-05-09 DIAGNOSIS — M79604 Pain in right leg: Secondary | ICD-10-CM | POA: Diagnosis not present

## 2016-05-09 NOTE — Progress Notes (Signed)
Pre visit review using our clinic review tool, if applicable. No additional management support is needed unless otherwise documented below in the visit note. 

## 2016-05-09 NOTE — Telephone Encounter (Signed)
Noted.  Will evaluate. 

## 2016-05-09 NOTE — Progress Notes (Signed)
Subjective:    Patient ID: Manuel Neal, male    DOB: [birth year], 65 y.o.   MRN: [id number]  HPI  Mr. Friloux is a 65 year old male with a history of atrial fibrillation (corrected via ablation years ago) managed on aspirin 81 mg who presents today with a chief complaint of lower extremity pain. His pain is located to the right upper calf. He first noticed this after he stood up after waking from a nap this afternon. He describes his pain as a discomfort which is more noticeable when walking. He denies swelling, erythema, history of blood clots, long travel, and is a non smoker. He denies shortness of breath, chest pain, dizziness, palpitations. He has been moving and lifting boxes for the last several weeks. He did move his couch several days ago.  Review of Systems  Respiratory: Negative for shortness of breath.   Cardiovascular: Negative for chest pain and palpitations.  Musculoskeletal: Positive for myalgias.  Skin: Negative for color change.  Neurological: Negative for weakness.       Past Medical History:  Diagnosis Date  . Atrial fibrillation /Atrial flutter 07/07/2008  . Bone spur    RT great toe  . BPH (benign prostatic hyperplasia)   . Keratosis    actinic, left ear  . Prostate cancer ([place])    march/2014  . Psoriasis   . Skin cancer 01/14/2010     Social History   Social History  . Marital status: Married    Spouse name: N/A  . Number of children: N/A  . Years of education: N/A   Occupational History  . BROADCAST [employer]   Social History Main Topics  . Smoking status: Never Smoker  . Smokeless tobacco: Never Used  . Alcohol use 0.0 oz/week     Comment: rare  . Drug use: No  . Sexual activity: Yes    Partners: Female   Other Topics Concern  . Not on file   Social History Narrative   Lives in [place] with spouse.   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 ATRIAL FIBRILLATION ABLATION N/A 08/05/2011   Procedure: ATRIAL FIBRILLATION ABLATION;  Surgeon: Thompson Grayer, MD;  Location: [facility] CATH LAB;  Service: Cardiovascular;  Laterality: N/A;  . atrial fibrillation and atrial flutter ablation  08/05/11   PVI and CTI ablation by Dr Rayann Heman  . bone spur     right foot  . COLONOSCOPY    . TEE WITHOUT CARDIOVERSION  08/05/2011   Procedure: TRANSESOPHAGEAL ECHOCARDIOGRAM (TEE);  Surgeon: Darden Amber., MD;  Location: [facility] ENDOSCOPY;  Service: Cardiovascular;  Laterality: N/A;    Family History  Problem Relation Age of Onset  . Cancer Mother     bile duct  . Colon cancer Neg Hx   . Stomach cancer Neg Hx     No Known Allergies  Current Outpatient Prescriptions on File Prior to Visit  Medication Sig Dispense Refill  . aspirin 81 MG tablet Take 81 mg by mouth daily.    . clobetasol cream (TEMOVATE) 0.05 % Apply topically 2 (two) times daily. 60 g 2  . finasteride (PROSCAR) 5 MG tablet Take 5 mg by mouth 2 (two) times a week.     . Multiple Vitamin (MULTIVITAMIN) tablet Take 1 tablet by  mouth 2 (two) times daily.     . NON FORMULARY [employer]     Take 2 tablets at each meal.   (Made from 55 raw fruits and vegetables)    . Red Yeast Rice Extract (RED YEAST RICE PO) Take 2 tablets by mouth 2 (two) times daily.    . sildenafil (REVATIO) 20 MG tablet TAKE 1-5 TABLETS BY MOUTH 30 MINUTES BEFORE INTERCOURSE AS DISCUSSED 10 tablet 5   No current facility-administered medications on file prior to visit.     BP (!) 150/100   Pulse 80   Temp 98 F (36.7 C) (Oral)   Ht 5' 11.5" (1.816 m)   Wt 208 lb 12.8 oz (94.7 kg)   SpO2 95%   BMI 28.72 kg/m    Objective:   Physical Exam  Constitutional: He appears well-nourished.  Neck: Neck supple.  Cardiovascular: Normal rate and regular rhythm.   Negative Homan's sign  Pulmonary/Chest: Effort normal and  breath sounds normal.  Musculoskeletal:  Calf non tender upon palpation  Skin: Skin is warm and dry. No erythema.  Left calf measuring 16.0 cm, right calf measuring 15.5 cm.           Assessment & Plan:  Lower extremity pain:  Located to right upper calf x several hours. Has been moving boxes and furniture. Exam today without evidence of DVT, cellulitis.  Strongly suspect MSK involvement. NSAIDs, Ice, stretching. Strict return precautions provided. He verbalized understanding.  Sheral Flow, NP

## 2016-05-09 NOTE — Telephone Encounter (Signed)
Pt has appt to see Allie Bossier NP today at 2:45.

## 2016-05-09 NOTE — Telephone Encounter (Signed)
°  Patient Name: Manuel Neal  DOB: 07/16/1950    Initial Comment Caller sates that he is concerned because yesterday he had a pain on the lower right leg since his nap yesterday. It hurts him to walk. He wants to know if there is a blood clot. At first it felt like pulled a muscle, and his wife says there is big veins. He takes baby aspirin and he did double up yesterday and wants to know if it is ok.    Nurse Assessment  Nurse: Verlin Fester RN, Stanton Kidney Date/Time Eilene Ghazi Time): 05/09/2016 12:14:23 PM  Confirm and document reason for call. If symptomatic, describe symptoms. ---Patient states he is having constant discomfort in his right lower leg. He has pain when walking.  Does the patient have any new or worsening symptoms? ---Yes  Will a triage be completed? ---Yes  Related visit to physician within the last 2 weeks? ---No  Does the PT have any chronic conditions? (i.e. diabetes, asthma, etc.) ---No  Is this a behavioral health or substance abuse call? ---No     Guidelines    Guideline Title Affirmed Question Affirmed Notes  Leg Pain [1] Thigh or calf pain AND [2] only 1 side AND [3] present > 1 hour    Final Disposition User   See Physician within 4 Hours (or PCP triage) Verlin Fester, RN, [facility]    Referrals  REFERRED TO PCP OFFICE   Disagree/Comply: Comply

## 2016-05-09 NOTE — Patient Instructions (Signed)
It is very unlikely you have a blood clot. Your symptoms and examination represent muscular involvement.   I recommend trying Ibuprofen 600 mg three times daily as needed for discomfort.   Apply ice to the back of your calf for discomfort.  Please notify me if you develop swelling, redness, increased pain to your calf.  It was a pleasure meeting you!

## 2016-06-05 ENCOUNTER — Encounter: Payer: 59 | Admitting: Family Medicine

## 2016-06-18 ENCOUNTER — Encounter: Payer: 59 | Admitting: Family Medicine

## 2016-06-25 ENCOUNTER — Encounter: Payer: 59 | Admitting: Family Medicine

## 2016-07-01 IMAGING — CR DG LUMBAR SPINE COMPLETE 4+V
5 series · 5 of 5 positions shown · non-contrast
Comparison: None.

CLINICAL DATA: Left-sided low back pain without sciatica.

EXAM:
LUMBAR SPINE - COMPLETE 4+ VIEW

[view not recorded (1 of 5)]
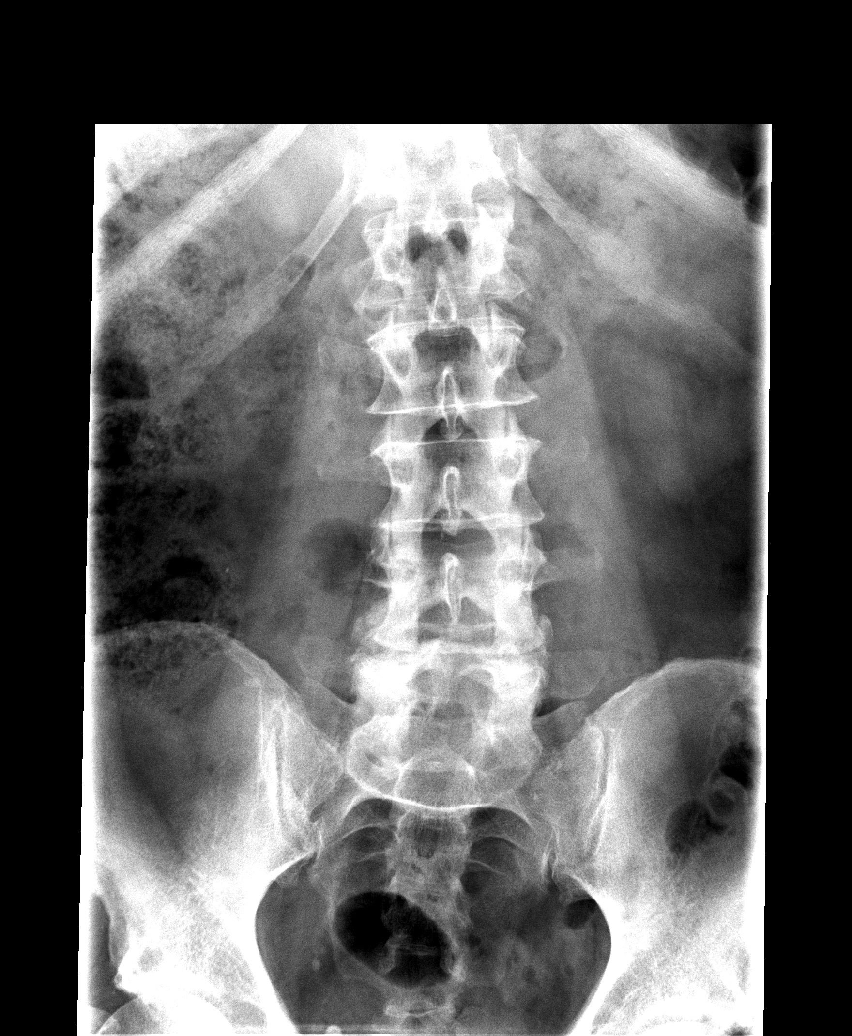

[view not recorded (2 of 5)]
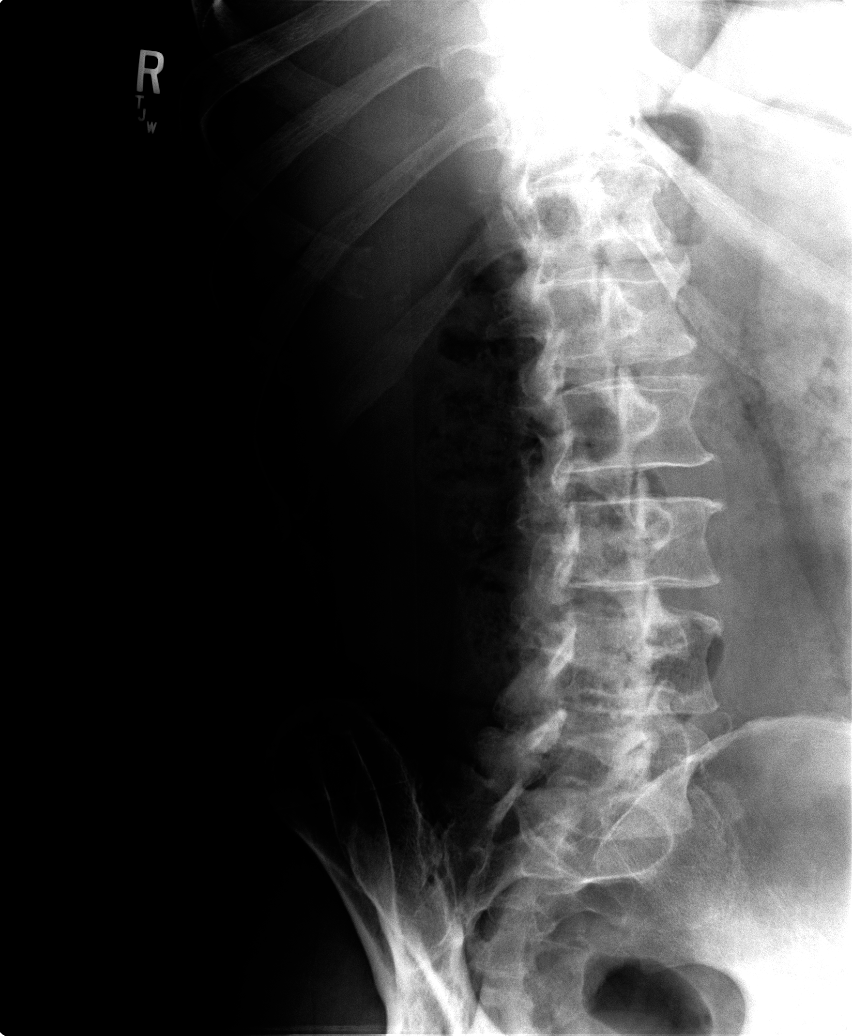

[view not recorded (3 of 5)]
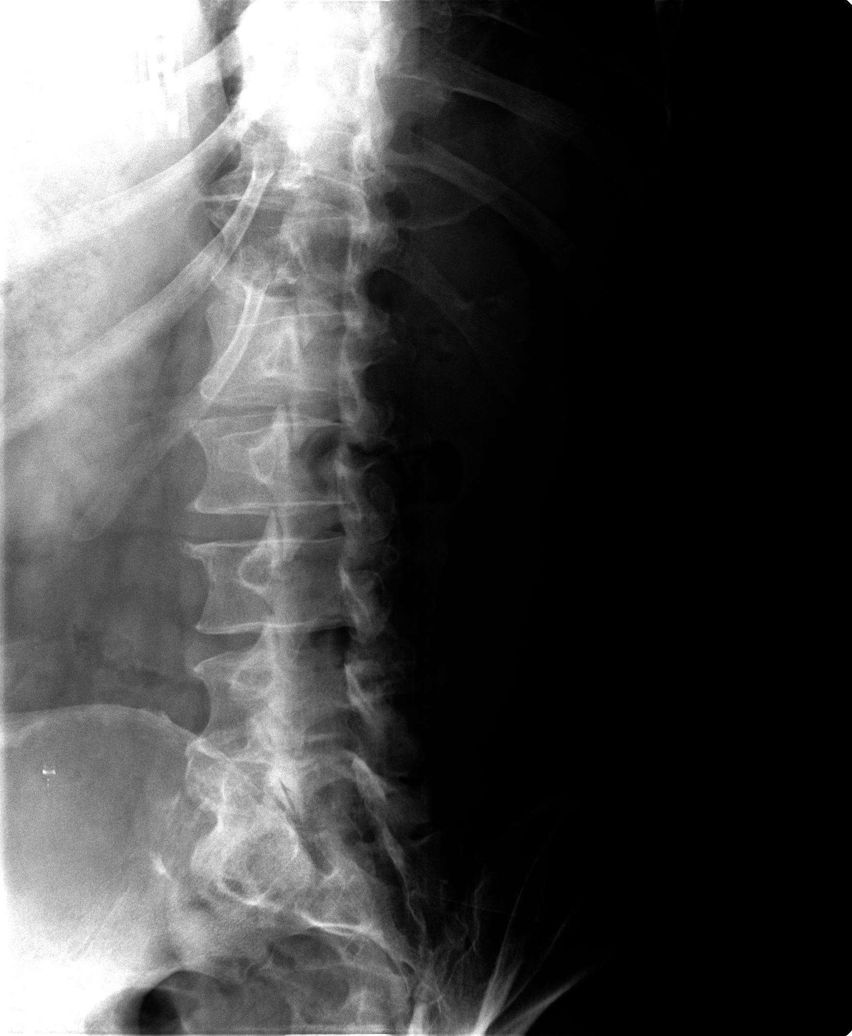

[view not recorded (4 of 5)]
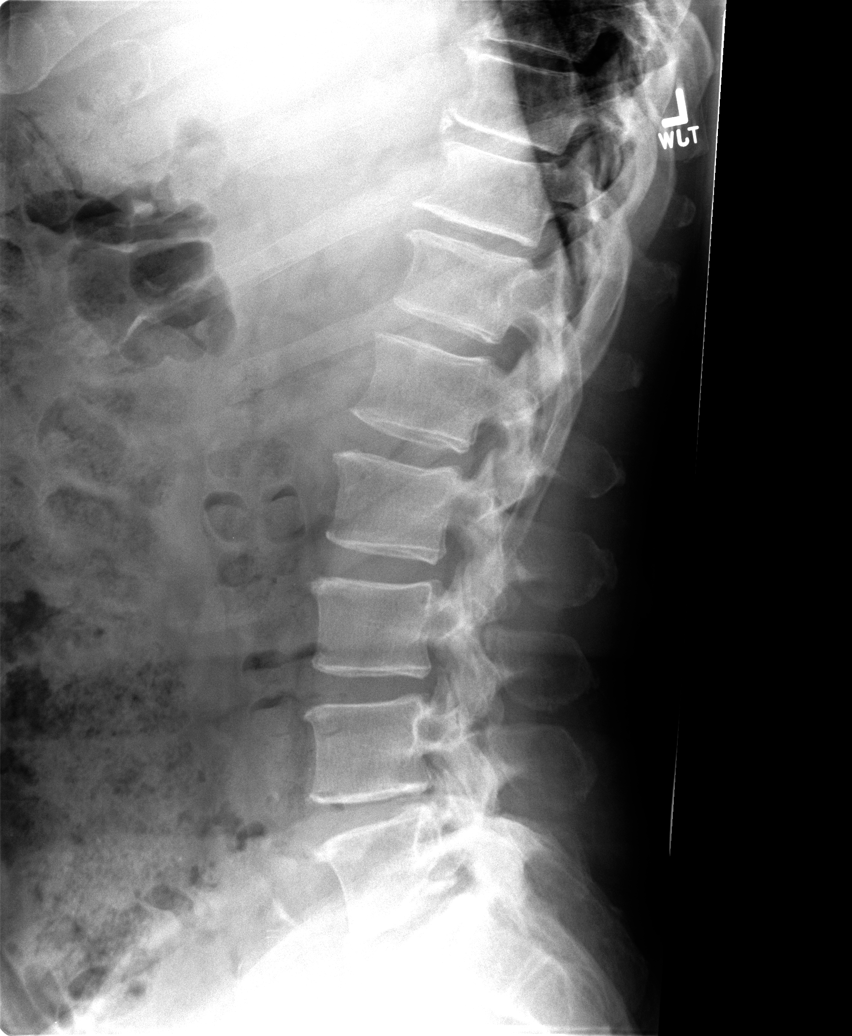

[view not recorded (5 of 5)]
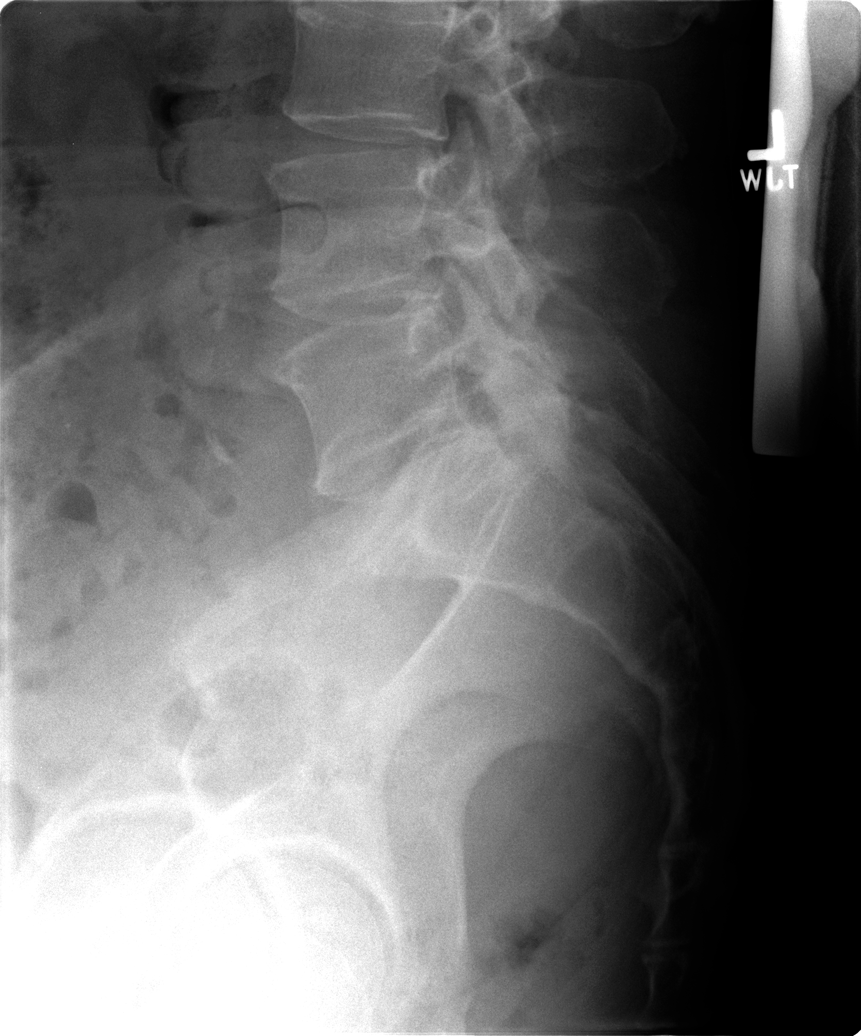

[5 of 5 positions shown; findings below may reference images not displayed]

FINDINGS: No fracture or spondylolisthesis is noted. Minimal anterior
osteophyte formation is noted at multiple levels. Disc spaces are
well-maintained. Hypertrophy of posterior facet joints is seen at
L5-S1 bilaterally and at L4-5 on the right secondary to degenerative
joint disease.
IMPRESSION: Mild degenerative changes as described above. No acute abnormality
seen in the lumbar spine.

## 2016-07-02 ENCOUNTER — Encounter: Payer: Self-pay | Admitting: Family Medicine

## 2016-07-02 ENCOUNTER — Ambulatory Visit (INDEPENDENT_AMBULATORY_CARE_PROVIDER_SITE_OTHER): Payer: 59 | Admitting: Family Medicine

## 2016-07-02 VITALS — BP 135/92 | HR 87 | Temp 97.9°F | Ht 71.5 in | Wt 208.5 lb

## 2016-07-02 DIAGNOSIS — Z0000 Encounter for general adult medical examination without abnormal findings: Secondary | ICD-10-CM | POA: Diagnosis not present

## 2016-07-02 NOTE — Progress Notes (Signed)
Dr. Frederico Hamman T. Astor Gentle, MD, [facility] Medicine [place], [address] Phone: [phone] Fax: [phone]  07/02/2016  Patient: Manuel Neal, MRN: [id number], DOB: 1951/05/15, 66 y.o.  Primary Physician:  Owens Loffler, MD   Chief Complaint  Patient presents with  . Annual Exam   Subjective:   Manuel Neal is a 66 y.o. pleasant patient who presents with the following:  Preventative Health Maintenance Visit:  Health Maintenance Summary Reviewed and updated, unless pt declines services.  Tobacco History Reviewed. Alcohol: No concerns, no excessive use Exercise Habits: still working out at home but decreased compared some to prior years, rec at least 30 mins 5 times a week STD concerns: no risk or activity to increase risk Drug Use: None Encouraged self-testicular check  Health Maintenance  Topic Date Due  . Hepatitis C Screening  10-07-1950  . HIV Screening  01/05/1967  . INFLUENZA VACCINE  08/30/2016 (Originally 01/01/2016)  . TETANUS/TDAP  07/07/2018  . COLONOSCOPY  09/02/2022  . ZOSTAVAX  Completed   Immunization History  Administered Date(s) Administered  . Td 07/07/2008  . Zoster 07/13/2012   Patient Active Problem List   Diagnosis Date Noted  . Low back pain 04/23/2015  . Hyperlipidemia 05/11/2014  . Allergy, unspecified not elsewhere classified 04/14/2013  . Cancer of prostate with low recurrence risk (stage T1-2a and Gleason < 7 and PSA < 10) ([place]) 08/26/2012  . BPH (benign prostatic hyperplasia) 08/04/2011  . ACTINIC KERATOSIS, EAR, LEFT 05/17/2010  . PSORIASIS 07/07/2008  . Atrial fibrillation /Atrial flutter 07/07/2008   Past Medical History:  Diagnosis Date  . Atrial fibrillation /Atrial flutter 07/07/2008  . Bone spur    RT great toe  . BPH (benign prostatic hyperplasia)   . Keratosis    actinic, left ear  . Prostate cancer ([place])    march/2014  . Psoriasis   . Skin cancer 01/14/2010    Past Surgical History:  Procedure Laterality Date  . ATRIAL FIBRILLATION ABLATION N/A 08/05/2011   Procedure: ATRIAL FIBRILLATION ABLATION;  Surgeon: Thompson Grayer, MD;  Location: [facility] CATH LAB;  Service: Cardiovascular;  Laterality: N/A;  . atrial fibrillation and atrial flutter ablation  08/05/11   PVI and CTI ablation by Dr Rayann Heman  . bone spur     right foot  . COLONOSCOPY    . TEE WITHOUT CARDIOVERSION  08/05/2011   Procedure: TRANSESOPHAGEAL ECHOCARDIOGRAM (TEE);  Surgeon: Darden Amber., MD;  Location: [facility] ENDOSCOPY;  Service: Cardiovascular;  Laterality: N/A;   Social History   Social History  . Marital status: Married    Spouse name: N/A  . Number of children: N/A  . Years of education: N/A   Occupational History  . [employer]   Social History Main Topics  . Smoking status: Never Smoker  . Smokeless tobacco: Never Used  . Alcohol use 0.0 oz/week     Comment: rare  . Drug use: No  . Sexual activity: Yes    Partners: Female   Other Topics Concern  . Not on file   Social History Narrative   Lives in [place] with spouse.   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 Cancer Mother     bile duct  . Colon cancer  Neg Hx   . Stomach cancer Neg Hx    No Known Allergies  Medication list has been reviewed and updated.   General: Denies fever, chills, sweats. No significant weight loss. Eyes: Denies blurring,significant itching ENT: Denies earache, sore throat, and hoarseness. Cardiovascular: Denies chest pains, palpitations, dyspnea on exertion Respiratory: Denies cough, dyspnea at rest,wheeezing Breast: no concerns about lumps GI: Denies nausea, vomiting, diarrhea, constipation, change in bowel habits, abdominal pain, melena, hematochezia GU: Denies penile discharge, ED,  urinary flow / outflow problems. No STD concerns. Musculoskeletal: Denies back pain, joint pain Derm: Denies rash, itching Neuro: Denies  paresthesias, frequent falls, frequent headaches Psych: Denies depression, anxiety Endocrine: Denies cold intolerance, heat intolerance, polydipsia Heme: Denies enlarged lymph nodes Allergy: No hayfever  Objective:   BP (!) 135/92 (BP Location: Right Arm, Cuff Size: Large)   Pulse 87   Temp 97.9 F (36.6 C) (Oral)   Ht 5' 11.5" (1.816 m)   Wt 208 lb 8 oz (94.6 kg)   BMI 28.67 kg/m  Ideal Body Weight: Weight in (lb) to have BMI = 25: 181.4  No exam data present  GEN: well developed, well nourished, no acute distress Eyes: conjunctiva and lids normal, PERRLA, EOMI ENT: TM clear, nares clear, oral exam WNL Neck: supple, no lymphadenopathy, no thyromegaly, no JVD Pulm: clear to auscultation and percussion, respiratory effort normal CV: regular rate and rhythm, S1-S2, no murmur, rub or gallop, no bruits, peripheral pulses normal and symmetric, no cyanosis, clubbing, edema or varicosities GI: soft, non-tender; no hepatosplenomegaly, masses; active bowel sounds all quadrants GU: no hernia, testicular mass, penile discharge Lymph: no cervical, axillary or inguinal adenopathy MSK: gait normal, muscle tone and strength WNL, no joint swelling, effusions, discoloration, crepitus  SKIN: clear, good turgor, color WNL, no rashes, lesions, or ulcerations Neuro: normal mental status, normal strength, sensation, and motion Psych: alert; oriented to person, place and time, normally interactive and not anxious or depressed in appearance.  All labs reviewed with patient. Brings in his own labs from [facility] and reviewed.  Assessment and Plan:   Healthcare maintenance   Health Maintenance Exam: The patient's preventative maintenance and recommended screening tests for an annual wellness exam were reviewed in full today. Brought up to date unless services  declined.  Counselled on the importance of diet, exercise, and its role in overall health and mortality. The patient's FH and SH was reviewed, including their home life, tobacco status, and drug and alcohol status.  Follow-up in 1 year for physical exam or additional follow-up below.  Follow-up: No Follow-up on file. Or follow-up in 1 year if not noted.  Signed,  Maud Deed. Jailyn Langhorst, MD   Allergies as of 07/02/2016   No Known Allergies     Medication List       Accurate as of 07/02/16  5:23 PM. Always use your most recent med list.          aspirin 81 MG tablet Take 81 mg by mouth daily.   clobetasol cream 0.05 % Commonly known as:  TEMOVATE Apply topically 2 (two) times daily.   finasteride 5 MG tablet Commonly known as:  PROSCAR Take 5 mg by mouth 2 (two) times a week.   multivitamin tablet Take 1 tablet by mouth 2 (two) times daily.   NON FORMULARY [employer] Food     Take 2 tablets at each meal.   (Made from 55 raw fruits and vegetables)   RED YEAST RICE PO Take 2 tablets by  mouth 2 (two) times daily.   sildenafil 20 MG tablet Commonly known as:  REVATIO TAKE 1-5 TABLETS BY MOUTH 30 MINUTES BEFORE INTERCOURSE AS DISCUSSED

## 2016-07-02 NOTE — Progress Notes (Signed)
Pre visit review using our clinic review tool, if applicable. No additional management support is needed unless otherwise documented below in the visit note. 

## 2017-02-25 ENCOUNTER — Encounter: Payer: Self-pay | Admitting: Family Medicine

## 2017-02-25 ENCOUNTER — Ambulatory Visit (INDEPENDENT_AMBULATORY_CARE_PROVIDER_SITE_OTHER): Payer: 59 | Admitting: Family Medicine

## 2017-02-25 VITALS — BP 104/70 | HR 73 | Temp 97.6°F | Ht 71.5 in | Wt 200.5 lb

## 2017-02-25 DIAGNOSIS — D2362 Other benign neoplasm of skin of left upper limb, including shoulder: Secondary | ICD-10-CM

## 2017-02-25 DIAGNOSIS — K4090 Unilateral inguinal hernia, without obstruction or gangrene, not specified as recurrent: Secondary | ICD-10-CM | POA: Diagnosis not present

## 2017-02-25 DIAGNOSIS — D367 Benign neoplasm of other specified sites: Secondary | ICD-10-CM

## 2017-02-25 MED ORDER — LORAZEPAM 0.5 MG PO TABS: ORAL_TABLET | ORAL | 0 refills | Status: AC

## 2017-02-25 NOTE — Progress Notes (Signed)
Dr. Frederico Hamman T. Jarl Sellitto, MD, [facility] and Sports Medicine [place], [address] Phone: [phone] Fax: [phone]  02/25/2017  Patient: Manuel Neal, MRN: [id number], DOB: 08-20-1950, 66 y.o.  Primary Physician:  Owens Loffler, MD   Chief Complaint  Patient presents with  . Bump on Lower Left Forearm  . Mass    Right Lower Adbomen   Subjective:   Manuel Neal is a 66 y.o. very pleasant male patient who presents with the following:  Stand had a couple of things he wanted me to evaluate including a small bump on his left forearm that it been there for quite some time.  Hasn't really been changing at all.  He also has a bulge it is in his right lower abdomen is not painful.  Doesn't bother him when he is working out.  Doesn't bother him basically all the other than this there.  Past Medical History, Surgical History, Social History, Family History, Problem List, Medications, and Allergies have been reviewed and updated if relevant.  Patient Active Problem List   Diagnosis Date Noted  . Low back pain 04/23/2015  . Hyperlipidemia 05/11/2014  . Allergy, unspecified not elsewhere classified 04/14/2013  . Cancer of prostate with low recurrence risk (stage T1-2a and Gleason < 7 and PSA < 10) ([place]) 08/26/2012  . BPH (benign prostatic hyperplasia) 08/04/2011  . ACTINIC KERATOSIS, EAR, LEFT 05/17/2010  . PSORIASIS 07/07/2008  . Atrial fibrillation /Atrial flutter 07/07/2008    Past Medical History:  Diagnosis Date  . Atrial fibrillation /Atrial flutter 07/07/2008  . Bone spur    RT great toe  . BPH (benign prostatic hyperplasia)   . Keratosis    actinic, left ear  . Prostate cancer ([place])    march/2014  . Psoriasis   . Skin cancer 01/14/2010    Past Surgical History:  Procedure Laterality Date  . ATRIAL FIBRILLATION ABLATION N/A 08/05/2011   Procedure: ATRIAL FIBRILLATION ABLATION;  Surgeon: Thompson Grayer, MD;  Location: [facility] CATH  LAB;  Service: Cardiovascular;  Laterality: N/A;  . atrial fibrillation and atrial flutter ablation  08/05/11   PVI and CTI ablation by Dr Rayann Heman  . bone spur     right foot  . COLONOSCOPY    . TEE WITHOUT CARDIOVERSION  08/05/2011   Procedure: TRANSESOPHAGEAL ECHOCARDIOGRAM (TEE);  Surgeon: Darden Amber., MD;  Location: [facility] ENDOSCOPY;  Service: Cardiovascular;  Laterality: N/A;    Social History   Social History  . Marital status: Married    Spouse name: N/A  . Number of children: N/A  . Years of education: N/A   Occupational History  . [employer]   Social History Main Topics  . Smoking status: Never Smoker  . Smokeless tobacco: Never Used  . Alcohol use 0.0 oz/week     Comment: rare  . Drug use: No  . Sexual activity: Yes    Partners: Female   Other Topics Concern  . Not on file   Social History Narrative   Lives in [place] with spouse.   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 Cancer Mother        bile duct  . Colon cancer Neg Hx   . Stomach cancer Neg Hx  No Known Allergies  Medication list reviewed and updated in full in [place].  ROS: GEN: Acute illness details above GI: Tolerating PO intake GU: maintaining adequate hydration and urination Pulm: No SOB Interactive and getting along well at home.  Otherwise, ROS is as per the HPI.  Objective:   BP 104/70   Pulse 73   Temp 97.6 F (36.4 C) (Oral)   Ht 5' 11.5" (1.816 m)   Wt 200 lb 8 oz (90.9 kg)   BMI 27.57 kg/m    GEN: WDWN, NAD, Non-toxic, Alert & Oriented x 3 HEENT: Atraumatic, Normocephalic.  Ears and Nose: No external deformity. EXTR: No clubbing/cyanosis/edema NEURO: Normal gait.  PSYCH: Normally interactive. Conversant. Not depressed or anxious appearing.  Calm  demeanor.    Left forearm, there is a circumferential cyst approximately 1 cm that is easily movable and hard.  In the right groin there is a palpable bulge that worsens somewhat with Valsalva but is not tender.   Laboratory and Imaging Data:  Assessment and Plan:   Right inguinal hernia  Cyst, dermoid, arm, left  Safe to watch probable hernia for now.  No pain.  It is not interfering with his workouts.  I would also only watch the cyst in his left forearm.  He can always had that removed electively.  Ativan for flying.  Follow-up: No Follow-up on file.  Meds ordered this encounter  Medications  . LORazepam (ATIVAN) 0.5 MG tablet    Sig: 1/2 to 1 tab po prior to flying    Dispense:  10 tablet    Refill:  0   Signed,  Tamila Gaulin T. Kentaro Alewine, MD   Patient's Medications  New Prescriptions   LORAZEPAM (ATIVAN) 0.5 MG TABLET    1/2 to 1 tab po prior to flying  Previous Medications   ASPIRIN 81 MG TABLET    Take 81 mg by mouth daily.   CLOBETASOL CREAM (TEMOVATE) 0.05 %    Apply topically 2 (two) times daily.   FINASTERIDE (PROSCAR) 5 MG TABLET    Take 5 mg by mouth 2 (two) times a week.    MULTIPLE VITAMIN (MULTIVITAMIN) TABLET    Take 1 tablet by mouth 2 (two) times daily.    NON FORMULARY    [employer] Food     Take 2 tablets at each meal.   (Made from 55 raw fruits and vegetables)   RED YEAST RICE EXTRACT (RED YEAST RICE PO)    Take 2 tablets by mouth 2 (two) times daily.   SILDENAFIL (REVATIO) 20 MG TABLET    TAKE 1-5 TABLETS BY MOUTH 30 MINUTES BEFORE INTERCOURSE AS DISCUSSED  Modified Medications   No medications on file  Discontinued Medications   No medications on file

## 2017-08-02 ENCOUNTER — Emergency Department (INDEPENDENT_AMBULATORY_CARE_PROVIDER_SITE_OTHER)
Admission: EM | Admit: 2017-08-02 | Discharge: 2017-08-02 | Disposition: A | Discharge status: Home / Self Care | Payer: 59 | Source: Home / Self Care | Attending: Family Medicine | Admitting: Family Medicine

## 2017-08-02 ENCOUNTER — Other Ambulatory Visit: Payer: Self-pay

## 2017-08-02 ENCOUNTER — Encounter: Payer: Self-pay | Admitting: Emergency Medicine

## 2017-08-02 DIAGNOSIS — H6591 Unspecified nonsuppurative otitis media, right ear: Secondary | ICD-10-CM

## 2017-08-02 DIAGNOSIS — H6091 Unspecified otitis externa, right ear: Secondary | ICD-10-CM

## 2017-08-02 MED ORDER — OFLOXACIN 0.3 % OT SOLN: OTIC | 0 refills | Status: DC

## 2017-08-02 MED ORDER — PREDNISONE 20 MG PO TABS: ORAL_TABLET | ORAL | 0 refills | Status: DC

## 2017-08-02 MED ORDER — AMOXICILLIN-POT CLAVULANATE 875-125 MG PO TABS: 1.0000 | ORAL_TABLET | Freq: Two times a day (BID) | ORAL | 0 refills | Status: DC

## 2017-08-02 NOTE — ED Triage Notes (Signed)
Patient has had problems with right ear since 12/19; has been seen by ENT but condition did not clear. Yesterday it began feeling uncomfortable including discomfort along anterior cervical nodes.

## 2017-08-02 NOTE — Discharge Instructions (Signed)
May add Pseudoephedrine (30mg, one or two every 4 to 6 hours) for sinus congestion.    

## 2017-08-02 NOTE — ED Provider Notes (Signed)
[facility]    CSN: [id number] Arrival date & time: 08/02/17  1638     History   Chief Complaint Chief Complaint  Patient presents with  . Ear Problem    HPI Manuel Neal is a 67 y.o. male.   Patient had removal of cerumen from right ear two months ago.  Since then his right ear has felt occluded with decreased hearing.  Yesterday he developed increasing soreness in his right ear.  No nasal congestion.  No fevers, chills, and sweats.   The history is provided by the patient.  Ear Fullness  This is a chronic problem. Episode onset: 2 months ago. The problem occurs constantly. The problem has been gradually worsening. Nothing aggravates the symptoms. Nothing relieves the symptoms. Treatments tried: ear wax removal solution. The treatment provided no relief.    Past Medical History:  Diagnosis Date  . Atrial fibrillation /Atrial flutter 07/07/2008  . Bone spur    RT great toe  . BPH (benign prostatic hyperplasia)   . Keratosis    actinic, left ear  . Prostate cancer ([place])    march/2014  . Psoriasis   . Skin cancer 01/14/2010    Patient Active Problem List   Diagnosis Date Noted  . Low back pain 04/23/2015  . Hyperlipidemia 05/11/2014  . Allergy, unspecified not elsewhere classified 04/14/2013  . Cancer of prostate with low recurrence risk (stage T1-2a and Gleason < 7 and PSA < 10) ([place]) 08/26/2012  . BPH (benign prostatic hyperplasia) 08/04/2011  . ACTINIC KERATOSIS, EAR, LEFT 05/17/2010  . PSORIASIS 07/07/2008  . Atrial fibrillation /Atrial flutter 07/07/2008    Past Surgical History:  Procedure Laterality Date  . ATRIAL FIBRILLATION ABLATION N/A 08/05/2011   Procedure: ATRIAL FIBRILLATION ABLATION;  Surgeon: Thompson Grayer, MD;  Location: [facility] CATH LAB;  Service: Cardiovascular;  Laterality: N/A;  . atrial fibrillation and atrial flutter ablation  08/05/11   PVI and CTI ablation by Dr Rayann Heman  . bone spur     right foot  . COLONOSCOPY    . prostate cancer     . TEE WITHOUT CARDIOVERSION  08/05/2011   Procedure: TRANSESOPHAGEAL ECHOCARDIOGRAM (TEE);  Surgeon: Darden Amber., MD;  Location: [facility] ENDOSCOPY;  Service: Cardiovascular;  Laterality: N/A;       Home Medications    Prior to Admission medications   Medication Sig Start Date End Date Taking? Authorizing Provider  amoxicillin-clavulanate (AUGMENTIN) 875-125 MG tablet Take 1 tablet by mouth 2 (two) times daily. Take with food 08/02/17   Kandra Nicolas, MD  aspirin 81 MG tablet Take 81 mg by mouth daily.    [provider]  clobetasol cream (TEMOVATE) 0.05 % Apply topically 2 (two) times daily. 07/07/12   Copland, Frederico Hamman, MD  finasteride (PROSCAR) 5 MG tablet Take 5 mg by mouth 2 (two) times a week.     [provider]  LORazepam (ATIVAN) 0.5 MG tablet 1/2 to 1 tab po prior to flying 02/25/17   Copland, Frederico Hamman, MD  Multiple Vitamin (MULTIVITAMIN) tablet Take 1 tablet by mouth 2 (two) times daily.     [provider]  NON [facility] Food     Take 2 tablets at each meal.   (Made from 55 raw fruits and vegetables)    [provider]  ofloxacin (FLOXIN) 0.3 % OTIC solution Place 10 drops into the right ear once daily for 7 days. 08/02/17   Kandra Nicolas, MD  predniSONE (DELTASONE) 20 MG  tablet Take one tab by mouth twice daily for 4 days, then one daily. Take with food. 08/02/17   Kandra Nicolas, MD  Red Yeast Rice Extract (RED YEAST RICE PO) Take 2 tablets by mouth 2 (two) times daily.    [provider]  sildenafil (REVATIO) 20 MG tablet TAKE 1-5 TABLETS BY MOUTH 30 MINUTES BEFORE INTERCOURSE AS DISCUSSED 11/24/15   Copland, Frederico Hamman, MD    Family History Family History  Problem Relation Age of Onset  . Cancer Mother        bile duct  . Colon cancer Neg Hx   . Stomach cancer Neg Hx     Social History Social History   Tobacco Use  . Smoking status: Never Smoker  . Smokeless tobacco: Never Used  Substance Use Topics  . Alcohol  use: Yes    Alcohol/week: 0.0 oz    Comment: rare  . Drug use: No     Allergies   Patient has no known allergies.   Review of Systems Review of Systems  All other systems reviewed and are negative.    Physical Exam Triage Vital Signs ED Triage Vitals  Enc Vitals Group     BP 08/02/17 1718 123/81     Pulse Rate 08/02/17 1718 91     Resp 08/02/17 1718 16     Temp 08/02/17 1718 97.9 F (36.6 C)     Temp Source 08/02/17 1718 Oral     SpO2 08/02/17 1718 96 %     Weight 08/02/17 1719 198 lb (89.8 kg)     Height 08/02/17 1719 6' (1.829 m)     Head Circumference --      Peak Flow --      Pain Score 08/02/17 1719 3     Pain Loc --      Pain Edu? --      Excl. in [place]? --    No data found.  Updated Vital Signs BP 123/81 (BP Location: Right Arm)   Pulse 91   Temp 97.9 F (36.6 C) (Oral)   Resp 16   Ht 6' (1.829 m)   Wt 198 lb (89.8 kg)   SpO2 96%   BMI 26.85 kg/m   Visual Acuity Right Eye Distance:   Left Eye Distance:   Bilateral Distance:    Right Eye Near:   Left Eye Near:    Bilateral Near:     Physical Exam Nursing notes and Vital Signs reviewed. Appearance:  Patient appears stated age, and in no acute distress Eyes:  Pupils are equal, round, and reactive to light and accomodation.  Extraocular movement is intact.  Conjunctivae are not inflamed  Ears:  Left canal and tympanic membrane normal.  Right canal erythematous and tender to insertion of speculum.  Left tympanic membrane erythematous with poor landmarks, and there is a small amount of purulent fluid adjacent to tympanic membrane. Nose:  Mildly congested turbinates.  No sinus tenderness. Pharynx:  Normal Neck:  Supple.  No adenopathy.   Lungs:  Clear to auscultation.  Breath sounds are equal.  Moving air well. Heart:  Regular rate and rhythm without murmurs, rubs, or gallops.   Skin:  No rash present.    UC Treatments / Results  Labs (all labs ordered are listed, but only abnormal results are  displayed) Labs Reviewed -  Tympanometry:  Right ear tympanogram low peak height and large ear volume; Left ear tympanogram normal  EKG  EKG Interpretation None  Radiology No results found.  Procedures Procedures (including critical care time)  Medications Ordered in UC Medications - No data to display   Initial Impression / Assessment and Plan / UC Course  I have reviewed the triage vital signs and the nursing notes.  Pertinent labs & imaging results that were available during my care of the patient were reviewed by me and considered in my medical decision making (see chart for details).    Begin ofloxacin otic suspension, 7drops daily for one week;  Augmentin 875 BID for 10 days, and prednisone burst/taper. May add Pseudoephedrine (30mg , one or two every 4 to 6 hours) for sinus congestion.   Recommend ENT follow-up.    Final Clinical Impressions(s) / UC Diagnoses   Final diagnoses:  Right otitis media with effusion  Otitis externa of right ear, unspecified chronicity, unspecified type    ED Discharge Orders        Ordered    ofloxacin (FLOXIN) 0.3 % OTIC solution     08/02/17 1805    amoxicillin-clavulanate (AUGMENTIN) 875-125 MG tablet  2 times daily     08/02/17 1805    predniSONE (DELTASONE) 20 MG tablet     08/02/17 1805          Kandra Nicolas, MD 08/08/17 1701

## 2017-08-03 ENCOUNTER — Telehealth: Payer: Self-pay

## 2017-08-20 ENCOUNTER — Other Ambulatory Visit (INDEPENDENT_AMBULATORY_CARE_PROVIDER_SITE_OTHER): Payer: 59

## 2017-08-20 DIAGNOSIS — Z1159 Encounter for screening for other viral diseases: Secondary | ICD-10-CM

## 2017-08-20 DIAGNOSIS — Z79899 Other long term (current) drug therapy: Secondary | ICD-10-CM | POA: Diagnosis not present

## 2017-08-20 DIAGNOSIS — E785 Hyperlipidemia, unspecified: Secondary | ICD-10-CM | POA: Diagnosis not present

## 2017-08-20 DIAGNOSIS — C61 Malignant neoplasm of prostate: Secondary | ICD-10-CM

## 2017-08-20 DIAGNOSIS — Z114 Encounter for screening for human immunodeficiency virus [HIV]: Secondary | ICD-10-CM

## 2017-08-20 LAB — BASIC METABOLIC PANEL
BUN: 18 mg/dL (ref 6–23)
CALCIUM: 10.4 mg/dL (ref 8.4–10.5)
CHLORIDE: 105 meq/L (ref 96–112)
CO2: 24 mEq/L (ref 19–32)
CREATININE: 1.01 mg/dL (ref 0.40–1.50)
GFR: 78.64 mL/min (ref >60.00)
Glucose, Bld: 112 mg/dL — ABNORMAL HIGH (ref 70–99)
Potassium: 4.5 mEq/L (ref 3.5–5.1)
Sodium: 141 mEq/L (ref 135–145)

## 2017-08-20 LAB — CBC WITH DIFFERENTIAL/PLATELET
BASOS ABS: 0 10*3/uL (ref 0.0–0.1)
Basophils Relative: 0.6 % (ref 0.0–3.0)
EOS ABS: 0.1 10*3/uL (ref 0.0–0.7)
Eosinophils Relative: 1.7 % (ref 0.0–5.0)
HEMATOCRIT: 48.6 % (ref 39.0–52.0)
HEMOGLOBIN: 16.3 g/dL (ref 13.0–17.0)
LYMPHS PCT: 22.6 % (ref 12.0–46.0)
Lymphs Abs: 1.9 10*3/uL (ref 0.7–4.0)
MCHC: 33.5 g/dL (ref 30.0–36.0)
MCV: 92.9 fl (ref 78.0–100.0)
MONOS PCT: 8.9 % (ref 3.0–12.0)
Monocytes Absolute: 0.8 10*3/uL (ref 0.1–1.0)
NEUTROS ABS: 5.7 10*3/uL (ref 1.4–7.7)
Neutrophils Relative %: 66.2 % (ref 43.0–77.0)
PLATELETS: 251 10*3/uL (ref 150.0–400.0)
RBC: 5.23 Mil/uL (ref 4.22–5.81)
RDW: 13.8 % (ref 11.5–15.5)
WBC: 8.5 10*3/uL (ref 4.0–10.5)

## 2017-08-20 LAB — HEPATIC FUNCTION PANEL
ALK PHOS: 51 U/L (ref 39–117)
ALT: 24 U/L (ref 0–53)
AST: 23 U/L (ref 0–37)
Albumin: 4.7 g/dL (ref 3.5–5.2)
BILIRUBIN DIRECT: 0.1 mg/dL (ref 0.0–0.3)
TOTAL PROTEIN: 7.1 g/dL (ref 6.0–8.3)
Total Bilirubin: 0.5 mg/dL (ref 0.2–1.2)

## 2017-08-20 LAB — LIPID PANEL
CHOL/HDL RATIO: 5
Cholesterol: 254 mg/dL — ABNORMAL HIGH (ref 0–200)
HDL: 46.9 mg/dL (ref >39.00)
LDL CALC: 167 mg/dL — AB (ref 0–99)
NONHDL: 207.08
Triglycerides: 199 mg/dL — ABNORMAL HIGH (ref 0.0–149.0)
VLDL: 39.8 mg/dL (ref 0.0–40.0)

## 2017-08-21 LAB — HEPATITIS C ANTIBODY
HEP C AB: NONREACTIVE
SIGNAL TO CUT-OFF: 0.02 (ref <1.00)

## 2017-08-21 LAB — PSA, TOTAL AND FREE
PSA, % Free: 19 % (calc) — ABNORMAL LOW (ref >25)
PSA, FREE: 1.5 ng/mL
PSA, Total: 7.8 ng/mL — ABNORMAL HIGH (ref <=4.0)

## 2017-08-21 LAB — HIV ANTIBODY (ROUTINE TESTING W REFLEX): HIV: NONREACTIVE

## 2017-08-26 ENCOUNTER — Encounter: Payer: 59 | Admitting: Family Medicine

## 2017-08-26 ENCOUNTER — Encounter: Payer: Self-pay | Admitting: Family Medicine

## 2017-08-26 ENCOUNTER — Ambulatory Visit (INDEPENDENT_AMBULATORY_CARE_PROVIDER_SITE_OTHER): Payer: 59 | Admitting: Family Medicine

## 2017-08-26 ENCOUNTER — Other Ambulatory Visit: Payer: Self-pay

## 2017-08-26 VITALS — BP 120/90 | HR 85 | Temp 97.8°F | Ht 71.5 in | Wt 206.0 lb

## 2017-08-26 DIAGNOSIS — Z0000 Encounter for general adult medical examination without abnormal findings: Secondary | ICD-10-CM

## 2017-08-26 NOTE — Progress Notes (Signed)
Dr. Frederico Hamman T. Harlee Pursifull, MD, [facility] [place], [address] Phone: [phone] Fax: [phone]  08/26/2017  Patient: Manuel Neal, MRN: [id number], DOB: Apr 01, 1951, 67 y.o.  Primary Physician:  Owens Loffler, MD   Chief Complaint  Patient presents with  . Annual Exam   Subjective:   Manuel Neal is a 67 y.o. pleasant patient who presents with the following:  Preventative Health Maintenance Visit:  Health Maintenance Summary Reviewed and updated, unless pt declines services.  Tobacco History Reviewed. Alcohol: No concerns, no excessive use Exercise Habits: Activity - mostly works out at home STD concerns: no risk or activity to increase risk Drug Use: None Encouraged self-testicular check  Health Maintenance  Topic Date Due  . INFLUENZA VACCINE  08/30/2017 (Originally 12/31/2016)  . PNA vac Low Risk Adult (1 of 2 - PCV13) 08/27/2018 (Originally 01/04/2017)  . TETANUS/TDAP  07/07/2018  . COLONOSCOPY  09/02/2022  . Hepatitis C Screening  Completed  . HIV Screening  Completed   Immunization History  Administered Date(s) Administered  . Td 07/07/2008  . Zoster 07/13/2012   Patient Active Problem List   Diagnosis Date Noted  . Low back pain 04/23/2015  . Hyperlipidemia 05/11/2014  . Allergy, unspecified not elsewhere classified 04/14/2013  . Cancer of prostate with low recurrence risk (stage T1-2a and Gleason < 7 and PSA < 10) ([place]) 08/26/2012  . BPH (benign prostatic hyperplasia) 08/04/2011  . ACTINIC KERATOSIS, EAR, LEFT 05/17/2010  . PSORIASIS 07/07/2008  . Atrial fibrillation /Atrial flutter 07/07/2008   Past Medical History:  Diagnosis Date  . Atrial fibrillation /Atrial flutter 07/07/2008  . Bone spur    RT great toe  . BPH (benign prostatic hyperplasia)   . Keratosis    actinic, left ear  . Prostate cancer ([place])    march/2014  . Psoriasis   . Skin cancer 01/14/2010   Past Surgical  History:  Procedure Laterality Date  . ATRIAL FIBRILLATION ABLATION N/A 08/05/2011   Procedure: ATRIAL FIBRILLATION ABLATION;  Surgeon: Thompson Grayer, MD;  Location: [facility] CATH LAB;  Service: Cardiovascular;  Laterality: N/A;  . atrial fibrillation and atrial flutter ablation  08/05/11   PVI and CTI ablation by Dr Rayann Heman  . bone spur     right foot  . COLONOSCOPY    . prostate cancer    . TEE WITHOUT CARDIOVERSION  08/05/2011   Procedure: TRANSESOPHAGEAL ECHOCARDIOGRAM (TEE);  Surgeon: Darden Amber., MD;  Location: [facility] ENDOSCOPY;  Service: Cardiovascular;  Laterality: N/A;   Social History   Socioeconomic History  . Marital status: Married    Spouse name: Not on file  . Number of children: Not on file  . Years of education: Not on file  . Highest education level: Not on file  Occupational History  . Occupation: Advertising copywriter: [employer]  Social Needs  . Financial resource strain: Not on file  . Food insecurity:    Worry: Not on file    Inability: Not on file  . Transportation needs:    Medical: Not on file    Non-medical: Not on file  Tobacco Use  . Smoking status: Never Smoker  . Smokeless tobacco: Never Used  Substance and Sexual Activity  . Alcohol use: Yes    Alcohol/week: 0.0 oz    Comment: rare  . Drug use: No  . Sexual activity: Yes    Partners: Female  Lifestyle  . Physical  activity:    Days per week: Not on file    Minutes per session: Not on file  . Stress: Not on file  Relationships  . Social connections:    Talks on phone: Not on file    Gets together: Not on file    Attends religious service: Not on file    Active member of club or organization: Not on file    Attends meetings of clubs or organizations: Not on file    Relationship status: Not on file  . Intimate partner violence:    Fear of current or ex partner: Not on file    Emotionally abused: Not on file    Physically abused: Not on file    Forced sexual activity: Not on file  Other  Topics Concern  . Not on file  Social History Narrative   Lives in [place] with spouse.   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 Cancer Mother        bile duct  . Colon cancer Neg Hx   . Stomach cancer Neg Hx    No Known Allergies  Medication list has been reviewed and updated.   General: Denies fever, chills, sweats. No significant weight loss. Eyes: Denies blurring,significant itching ENT: Denies earache, sore throat, and hoarseness. Cardiovascular: Denies chest pains, palpitations, dyspnea on exertion Respiratory: Denies cough, dyspnea at rest,wheeezing Breast: no concerns about lumps GI: Denies nausea, vomiting, diarrhea, constipation, change in bowel habits, abdominal pain, melena, hematochezia GU: Denies penile discharge, ED, urinary flow / outflow problems. No STD concerns. Musculoskeletal: Denies back pain, joint pain Derm: Denies rash, itching Neuro: Denies  paresthesias, frequent falls, frequent headaches Psych: Denies depression, anxiety Endocrine: Denies cold intolerance, heat intolerance, polydipsia Heme: Denies enlarged lymph nodes Allergy: No hayfever  Objective:   BP 120/90   Pulse 85   Temp 97.8 F (36.6 C) (Oral)   Ht 5' 11.5" (1.816 m)   Wt 206 lb (93.4 kg)   BMI 28.33 kg/m  Ideal Body Weight: Weight in (lb) to have BMI = 25: 181.4  No exam data present  GEN: well developed, well nourished, no acute distress Eyes: conjunctiva and lids normal, PERRLA, EOMI ENT: TM clear, nares clear, oral exam WNL Neck: supple, no lymphadenopathy, no thyromegaly, no JVD Pulm: clear to auscultation and percussion, respiratory effort normal CV: regular rate and rhythm, S1-S2, no murmur, rub or gallop, no bruits, peripheral pulses normal and symmetric, no cyanosis, clubbing, edema  or varicosities GI: soft, non-tender; no hepatosplenomegaly, masses; active bowel sounds all quadrants GU: no hernia, testicular mass, penile discharge Lymph: no cervical, axillary or inguinal adenopathy MSK: gait normal, muscle tone and strength WNL, no joint swelling, effusions, discoloration, crepitus  SKIN: clear, good turgor, color WNL, no rashes, lesions, or ulcerations Neuro: normal mental status, normal strength, sensation, and motion Psych: alert; oriented to person, place and time, normally interactive and not anxious or depressed in appearance. All labs reviewed with patient.  Lipids:    Component Value Date/Time   CHOL 254 (H) 08/20/2017 0940   TRIG 199.0 (H) 08/20/2017 0940   HDL 46.90 08/20/2017 0940   LDLDIRECT 176.7 05/04/2014 0923   VLDL 39.8 08/20/2017 0940   CHOLHDL 5 08/20/2017 0940   CBC: CBC Latest Ref Rng & Units 08/20/2017 05/04/2014 06/06/2013  WBC 4.0 - 10.5 K/uL 8.5 7.0 8.3  Hemoglobin 13.0 - 17.0 g/dL 16.3 16.7 17.4(H)  Hematocrit 39.0 - 52.0 % 48.6 50.5 51.4  Platelets 150.0 - 400.0 K/uL 251.0 266.0 196.2    Basic Metabolic Panel:    Component Value Date/Time   NA 141 08/20/2017 0940   K 4.5 08/20/2017 0940   CL 105 08/20/2017 0940   CO2 24 08/20/2017 0940   BUN 18 08/20/2017 0940   CREATININE 1.01 08/20/2017 0940   CREATININE 1.14 08/01/2011 1532   GLUCOSE 112 (H) 08/20/2017 0940   CALCIUM 10.4 08/20/2017 0940   Hepatic Function Latest Ref Rng & Units 08/20/2017 05/04/2014 07/01/2012  Total Protein 6.0 - 8.3 g/dL 7.1 7.1 7.0  Albumin 3.5 - 5.2 g/dL 4.7 4.2 4.3  AST 0 - 37 U/L '23 22 23  '$ ALT 0 - 53 U/L '24 28 29  '$ Alk Phosphatase 39 - 117 U/L 51 50 51  Total Bilirubin 0.2 - 1.2 mg/dL 0.5 0.8 0.9  Bilirubin, Direct 0.0 - 0.3 mg/dL 0.1 0.0 0.1    Lab Results  Component Value Date   TSH 2.92 05/29/2011   Lab Results  Component Value Date   PSA 4.22 (H) 07/01/2012   PSA 3.75 07/17/2011   PSA 5.45 (H) 05/29/2011    Assessment and Plan:    Healthcare maintenance  Keep working on chol, think about going back to red yeast rice Afrin and flonase for r ear  Health Maintenance Exam: The patient's preventative maintenance and recommended screening tests for an annual wellness exam were reviewed in full today. Brought up to date unless services declined.  Counselled on the importance of diet, exercise, and its role in overall health and mortality. The patient's FH and SH was reviewed, including their home life, tobacco status, and drug and alcohol status.  Follow-up in 1 year for physical exam or additional follow-up below.  Follow-up: No follow-ups on file. Or follow-up in 1 year if not noted.  Signed,  Maud Deed. Delayne Sanzo, MD   Allergies as of 08/26/2017   No Known Allergies     Medication List        Accurate as of 08/26/17  1:51 PM. Always use your most recent med list.          aspirin 81 MG tablet Take 81 mg by mouth daily.   clobetasol cream 0.05 % Commonly known as:  TEMOVATE Apply topically 2 (two) times daily.   finasteride 5 MG tablet Commonly known as:  PROSCAR Take 5 mg by mouth 2 (two) times a week.   LORazepam 0.5 MG tablet Commonly known as:  ATIVAN 1/2 to 1 tab po prior to flying   Magnesium 400 MG Tabs Take 1 tablet by mouth 2 (two) times daily.   multivitamin tablet Take 1 tablet by mouth 2 (two) times daily.   NON FORMULARY [employer] Food     Take 2 tablets at each meal.   (Made from 55 raw fruits and vegetables)   sildenafil 20 MG tablet Commonly known as:  REVATIO TAKE 1-5 TABLETS BY MOUTH 30 MINUTES BEFORE INTERCOURSE AS DISCUSSED

## 2018-01-04 ENCOUNTER — Other Ambulatory Visit: Payer: Self-pay | Admitting: Urology

## 2018-01-04 DIAGNOSIS — C61 Malignant neoplasm of prostate: Secondary | ICD-10-CM

## 2018-01-27 ENCOUNTER — Other Ambulatory Visit: Payer: Self-pay | Admitting: Urology

## 2018-01-27 ENCOUNTER — Telehealth: Payer: Self-pay | Admitting: Nurse Practitioner

## 2018-01-27 ENCOUNTER — Ambulatory Visit
Admission: RE | Admit: 2018-01-27 | Discharge: 2018-01-27 | Disposition: A | Discharge status: Home / Self Care | Payer: 59 | Source: Ambulatory Visit | Attending: Urology | Admitting: Urology

## 2018-01-27 DIAGNOSIS — C61 Malignant neoplasm of prostate: Secondary | ICD-10-CM

## 2018-01-27 NOTE — Telephone Encounter (Signed)
Phone call to patient to review 13hr prep instructions for MRI on 9/1 at 09:30am. Pt verbalized understanding. Prescription called in to [facility] on [address]  Pt to take: 50mg  Prednisone on 01/30/2018 at 10:30pm 50mg  Prednisone on 01/31/2018 at 4:30am 50mg  Prednisone AND 50mg  Benadryl on 01/31/2018 at 10:30am

## 2018-01-31 ENCOUNTER — Inpatient Hospital Stay: Admission: RE | Admit: 2018-01-31 | Payer: 59 | Source: Ambulatory Visit

## 2018-02-02 ENCOUNTER — Ambulatory Visit
Admission: RE | Admit: 2018-02-02 | Discharge: 2018-02-02 | Disposition: A | Discharge status: Home / Self Care | Payer: 59 | Source: Ambulatory Visit | Attending: Urology | Admitting: Urology

## 2018-02-02 DIAGNOSIS — C61 Malignant neoplasm of prostate: Secondary | ICD-10-CM

## 2018-02-03 ENCOUNTER — Other Ambulatory Visit: Payer: 59

## 2018-02-07 ENCOUNTER — Other Ambulatory Visit: Payer: 59

## 2022-10-24 ENCOUNTER — Encounter: Payer: Self-pay | Admitting: Internal Medicine
